# Patient Record
Sex: Female | Born: 1980 | Race: White | Hispanic: No | Marital: Single | Smoking: Former smoker
Health system: Southern US, Community
[De-identification: ages and names within clinical notes are randomized; demographics above are authoritative.]

## PROBLEM LIST (undated history)

## (undated) DIAGNOSIS — J45909 Unspecified asthma, uncomplicated: Secondary | ICD-10-CM

## (undated) DIAGNOSIS — M109 Gout, unspecified: Secondary | ICD-10-CM

## (undated) DIAGNOSIS — F1910 Other psychoactive substance abuse, uncomplicated: Secondary | ICD-10-CM

## (undated) DIAGNOSIS — F419 Anxiety disorder, unspecified: Secondary | ICD-10-CM

## (undated) DIAGNOSIS — F1990 Other psychoactive substance use, unspecified, uncomplicated: Secondary | ICD-10-CM

## (undated) DIAGNOSIS — F329 Major depressive disorder, single episode, unspecified: Secondary | ICD-10-CM

## (undated) DIAGNOSIS — F32A Depression, unspecified: Secondary | ICD-10-CM

## (undated) HISTORY — DX: Depression, unspecified: F32.A

## (undated) HISTORY — DX: Other psychoactive substance abuse, uncomplicated: F19.10

## (undated) HISTORY — DX: Major depressive disorder, single episode, unspecified: F32.9

## (undated) HISTORY — DX: Anxiety disorder, unspecified: F41.9

---

## 2015-11-30 ENCOUNTER — Emergency Department: Payer: Self-pay

## 2015-11-30 ENCOUNTER — Emergency Department
Admission: EM | Admit: 2015-11-30 | Discharge: 2015-11-30 | Disposition: A | Discharge status: Home / Self Care | Payer: Self-pay | Attending: Emergency Medicine | Admitting: Emergency Medicine

## 2015-11-30 ENCOUNTER — Encounter: Payer: Self-pay | Admitting: Emergency Medicine

## 2015-11-30 DIAGNOSIS — G8929 Other chronic pain: Secondary | ICD-10-CM | POA: Insufficient documentation

## 2015-11-30 DIAGNOSIS — F17210 Nicotine dependence, cigarettes, uncomplicated: Secondary | ICD-10-CM | POA: Insufficient documentation

## 2015-11-30 DIAGNOSIS — Y990 Civilian activity done for income or pay: Secondary | ICD-10-CM | POA: Insufficient documentation

## 2015-11-30 DIAGNOSIS — X58XXXA Exposure to other specified factors, initial encounter: Secondary | ICD-10-CM | POA: Insufficient documentation

## 2015-11-30 DIAGNOSIS — Y929 Unspecified place or not applicable: Secondary | ICD-10-CM | POA: Insufficient documentation

## 2015-11-30 DIAGNOSIS — S39012A Strain of muscle, fascia and tendon of lower back, initial encounter: Secondary | ICD-10-CM | POA: Insufficient documentation

## 2015-11-30 DIAGNOSIS — Y9389 Activity, other specified: Secondary | ICD-10-CM | POA: Insufficient documentation

## 2015-11-30 LAB — POC URINE PREG, ED: Preg Test, Ur: NEGATIVE

## 2015-11-30 MED ORDER — KETOROLAC TROMETHAMINE 60 MG/2ML IM SOLN
INTRAMUSCULAR | Status: AC
  Administered 2015-11-30: 60 mg via INTRAMUSCULAR
  Filled 2015-11-30: qty 2

## 2015-11-30 MED ORDER — KETOROLAC TROMETHAMINE 60 MG/2ML IM SOLN
60.0000 mg | Freq: Once | INTRAMUSCULAR | Status: AC
  Administered 2015-11-30: 60 mg via INTRAMUSCULAR

## 2015-11-30 MED ORDER — TRAMADOL HCL 50 MG PO TABS: 50.0000 mg | ORAL_TABLET | Freq: Four times a day (QID) | ORAL | 0 refills | Status: DC | PRN

## 2015-11-30 MED ORDER — CYCLOBENZAPRINE HCL 10 MG PO TABS
10.0000 mg | ORAL_TABLET | Freq: Three times a day (TID) | ORAL | 0 refills | Status: DC | PRN
Start: 2015-11-30 — End: 2016-06-02

## 2015-11-30 NOTE — ED Notes (Signed)
Patient transported to X-ray 

## 2015-11-30 NOTE — ED Triage Notes (Signed)
Patient reports right-sided lower back pain since yesterday. States the pain radiates down her leg to her foot. Denies injury. Denies urinary symptoms. Reports history of back problems in the past.

## 2015-11-30 NOTE — ED Provider Notes (Signed)
[facility] Emergency Department Provider Note   ____________________________________________   First MD Initiated Contact with Patient 11/30/15 1314     (approximate)  I have reviewed the triage vital signs and the nursing notes.   HISTORY  Chief Complaint Back Pain    HPI Caitlin GriffithsKelly Uliano is a 35 y.o. female patient complaining of right upper and low back pain since yesterday. Patient stated as of radicular component to her back pain to her right leg patient denies any injury. Patient state she has a history of chronic back pain which has not bothered her for the past 6 months. Patient state the past 6 months she has not been working. Patient started working 3 weeks ago. Patient stated no specific provocative incident happened at work yesterday. Patient denies any bladder or bowel dysfunction. Patient rates the pain as a 9/10. Patient described a pain as "sharp". No palliative measures for this complaint.   History reviewed. No pertinent past medical history.  There are no active problems to display for this patient.   Past Surgical History:  Procedure Laterality Date  . CESAREAN SECTION      Prior to Admission medications   Medication Sig Start Date End Date Taking? Authorizing Provider  cyclobenzaprine (FLEXERIL) 10 MG tablet Take 1 tablet (10 mg total) by mouth 3 (three) times daily as needed. 11/30/15   Joni Reiningonald K Thad Osoria, PA-C  traMADol (ULTRAM) 50 MG tablet Take 1 tablet (50 mg total) by mouth every 6 (six) hours as needed. 11/30/15 11/29/16  Joni Reiningonald K Kanija Remmel, PA-C    Allergies Patient has no known allergies.  No family history on file.  Social History Social History  Substance Use Topics  . Smoking status: Current Every Day Smoker    Packs/day: 0.50    Types: Cigarettes  . Smokeless tobacco: Never Used  . Alcohol use No    Review of Systems Constitutional: No fever/chills Eyes: No visual changes. ENT: No sore throat. Cardiovascular:  Denies chest pain. Respiratory: Denies shortness of breath. Gastrointestinal: No abdominal pain.  No nausea, no vomiting.  No diarrhea.  No constipation. Genitourinary: Negative for dysuria. Musculoskeletal: Positive for back pain  Skin: Negative for rash. Neurological: Negative for headaches, focal weakness or numbness.    ____________________________________________   PHYSICAL EXAM:  VITAL SIGNS: ED Triage Vitals  Enc Vitals Group     BP 11/30/15 1309 (!) 115/59     Pulse Rate 11/30/15 1309 90     Resp 11/30/15 1309 18     Temp 11/30/15 1309 98.4 F (36.9 C)     Temp Source 11/30/15 1309 Oral     SpO2 11/30/15 1309 99 %     Weight 11/30/15 1309 185 lb (83.9 kg)     Height 11/30/15 1309 5\' 2"  (1.575 m)     Head Circumference --      Peak Flow --      Pain Score 11/30/15 1310 9     Pain Loc --      Pain Edu? --      Excl. in GC? --     Constitutional: Alert and oriented.Moderate distress Eyes: Conjunctivae are normal. PERRL. EOMI. Head: Atraumatic. Nose: No congestion/rhinnorhea. Mouth/Throat: Mucous membranes are moist.  Oropharynx non-erythematous. Neck: No stridor.  No cervical spine tenderness to palpation. Hematological/Lymphatic/Immunilogical: No cervical lymphadenopathy. Cardiovascular: Normal rate, regular rhythm. Grossly normal heart sounds.  Good peripheral circulation. Respiratory: Normal respiratory effort.  No retractions. Lungs CTAB. Gastrointestinal: Soft and nontender. No distention. No abdominal  bruits. No CVA tenderness. Musculoskeletal: No obvious deformity to the lumbar spine. Patient moderate guarding palpation all spinal processes. Patient decreased range of motion's all fields. From a sitting position patient had a right straight leg test. Neurologic:  Normal speech and language. No gross focal neurologic deficits are appreciated. No gait instability. Skin:  Skin is warm, dry and intact. No rash noted. Psychiatric: Mood and affect are normal.  Speech and behavior are normal.  ____________________________________________   LABS (all labs ordered are listed, but only abnormal results are displayed)  Labs Reviewed  POC URINE PREG, ED   ____________________________________________  EKG   ____________________________________________  RADIOLOGY  No acute findings on x-ray of the lumbar spine. ____________________________________________   PROCEDURES  Procedure(s) performed: None  Procedures  Critical Care performed: No  ____________________________________________   INITIAL IMPRESSION / ASSESSMENT AND PLAN / ED COURSE  Pertinent labs & imaging results that were available during my care of the patient were reviewed by me and considered in my medical decision making (see chart for details). Acute low back pain. Discussed negative x-ray finding with patient. Patient given discharge care instructions. Patient given a prescription for Flexeril and tramadol. Patient given a work note. She advised [facility] condition [facility].  Clinical Course      ____________________________________________   FINAL CLINICAL IMPRESSION(S) / ED DIAGNOSES  Final diagnoses:  Strain of lumbar region, initial encounter      NEW MEDICATIONS STARTED DURING THIS VISIT:  New Prescriptions   CYCLOBENZAPRINE (FLEXERIL) 10 MG TABLET    Take 1 tablet (10 mg total) by mouth 3 (three) times daily as needed.   TRAMADOL (ULTRAM) 50 MG TABLET    Take 1 tablet (50 mg total) by mouth every 6 (six) hours as needed.     Note:  This document was prepared using Dragon voice recognition software and may include unintentional dictation errors.    Joni Reiningonald K Chalese Peach, PA-C 11/30/15 1455    Jene Everyobert Kinner, MD 11/30/15 [phone]

## 2015-12-06 ENCOUNTER — Emergency Department
Admission: EM | Admit: 2015-12-06 | Discharge: 2015-12-06 | Disposition: A | Discharge status: Home / Self Care | Payer: Self-pay | Attending: Emergency Medicine | Admitting: Emergency Medicine

## 2015-12-06 DIAGNOSIS — F17210 Nicotine dependence, cigarettes, uncomplicated: Secondary | ICD-10-CM | POA: Insufficient documentation

## 2015-12-06 DIAGNOSIS — M5441 Lumbago with sciatica, right side: Secondary | ICD-10-CM | POA: Insufficient documentation

## 2015-12-06 DIAGNOSIS — Z79899 Other long term (current) drug therapy: Secondary | ICD-10-CM | POA: Insufficient documentation

## 2015-12-06 MED ORDER — PREDNISONE 10 MG PO TABS: ORAL_TABLET | ORAL | 0 refills | Status: DC

## 2015-12-06 NOTE — ED Provider Notes (Signed)
[facility] Emergency Department Provider Note   ____________________________________________   First MD Initiated Contact with Patient 12/06/15 1427     (approximate)  I have reviewed the triage vital signs and the nursing notes.   HISTORY  Chief Complaint Back Pain    HPI Caitlin GriffithsKelly Hamler is a 35 y.o. female is here complaining of low back pain. Patient was seen last week for the same in which urinalysis was negative and lumbar spine x-ray did not show any abnormalities. Patient continues to deny any urinary symptoms. Patient states that her pain radiates now into her right leg. She denies any incontinence of bowel or bladder. Patient continues to ambulate without assistance. [facility]. Patient was prescribed tramadol and Flexeril when she was seen in the emergency room on 11/30/15. Patient states her pain today is an 8 out of 10.   No past medical history on file.  There are no active problems to display for this patient.   Past Surgical History:  Procedure Laterality Date  . CESAREAN SECTION      Prior to Admission medications   Medication Sig Start Date End Date Taking? Authorizing Provider  cyclobenzaprine (FLEXERIL) 10 MG tablet Take 1 tablet (10 mg total) by mouth 3 (three) times daily as needed. 11/30/15   Joni Reiningonald K Smith, PA-C  predniSONE (DELTASONE) 10 MG tablet Take 6 tablets  today, on day 2 take 5 tablets, day 3 take 4 tablets, day 4 take 3 tablets, day 5 take  2 tablets and 1 tablet the last day 12/06/15   Tommi Rumpshonda L Ashyra Cantin, PA-C  traMADol (ULTRAM) 50 MG tablet Take 1 tablet (50 mg total) by mouth every 6 (six) hours as needed. 11/30/15 11/29/16  Joni Reiningonald K Smith, PA-C    Allergies Patient has no known allergies.  No family history on file.  Social History Social History  Substance Use Topics  . Smoking status: Current Every Day Smoker    Packs/day: 0.50    Types: Cigarettes  . Smokeless tobacco:  Never Used  . Alcohol use No    Review of Systems Constitutional: No fever/chills Cardiovascular: Denies chest pain. Respiratory: Denies shortness of breath. Gastrointestinal: No abdominal pain.  No nausea, no vomiting.  Genitourinary: Negative for dysuria. Musculoskeletal: Positive for back pain with radiculopathy right leg. Skin: Negative for rash. Neurological: Negative for headaches, focal weakness or numbness.  10-point ROS otherwise negative.  ____________________________________________   PHYSICAL EXAM:  VITAL SIGNS: ED Triage Vitals  Enc Vitals Group     BP 12/06/15 1314 114/69     Pulse Rate 12/06/15 1314 97     Resp 12/06/15 1314 18     Temp 12/06/15 1314 98.5 F (36.9 C)     Temp Source 12/06/15 1314 Oral     SpO2 12/06/15 1314 99 %     Weight 12/06/15 1316 190 lb (86.2 kg)     Height 12/06/15 1316 5\' 2"  (1.575 m)     Head Circumference --      Peak Flow --      Pain Score 12/06/15 1317 8     Pain Loc --      Pain Edu? --      Excl. in GC? --     Constitutional: Alert and oriented. Well appearing and in no acute distress. Eyes: Conjunctivae are normal. PERRL. EOMI. Head: Atraumatic. Nose: No congestion/rhinnorhea. Neck: No stridor.   Cardiovascular: Normal rate, regular rhythm. Grossly normal heart  sounds.  Good peripheral circulation. Respiratory: Normal respiratory effort.  No retractions. Lungs CTAB. Gastrointestinal: Soft and nontender. No distention.  Musculoskeletal: On examination of the back there is no gross deformity noted and no active muscle spasm seen with range of motion. Initially when going in the room patient was bent over the arm of the exam chair asleep. There is diffuse tenderness on palpation but no point tenderness noted. Straight leg raises were 90 and negative in the left. Slightly positive on the right. Good muscle strength bilaterally. Normal gait was noted without assistance. Neurologic:  Normal speech and language. No gross  focal neurologic deficits are appreciated. No gait instability. Reflexes were 2+ bilaterally. Skin:  Skin is warm, dry and intact. No rash noted. Psychiatric: Mood and affect are normal. Speech and behavior are normal.  ____________________________________________   LABS (all labs ordered are listed, but only abnormal results are displayed)  Labs Reviewed - No data to display  RADIOLOGY  Reviewed from previous ER visit. ____________________________________________   PROCEDURES  Procedure(s) performed: None  Procedures  Critical Care performed: No  ____________________________________________   INITIAL IMPRESSION / ASSESSMENT AND PLAN / ED COURSE  Pertinent labs & imaging results that were available during my care of the patient were reviewed by me and considered in my medical decision making (see chart for details).    Clinical Course    Patient was started on prednisone 60 mg 6 day taper. Patient is encouraged to follow-up with Dr. Rosita KeaMenz if any continued problems with her back. Patient was given a note to take to work saying that she was in the emergency room today.  ____________________________________________   FINAL CLINICAL IMPRESSION(S) / ED DIAGNOSES  Final diagnoses:  Acute bilateral low back pain with right-sided sciatica      NEW MEDICATIONS STARTED DURING THIS VISIT:  New Prescriptions   PREDNISONE (DELTASONE) 10 MG TABLET    Take 6 tablets  today, on day 2 take 5 tablets, day 3 take 4 tablets, day 4 take 3 tablets, day 5 take  2 tablets and 1 tablet the last day     Note:  This document was prepared using Dragon voice recognition software and may include unintentional dictation errors.    Tommi RumpsRhonda L Alexismarie Flaim, PA-C 12/06/15 1450    Sharman CheekPhillip Stafford, MD 12/07/15 2216

## 2015-12-06 NOTE — Discharge Instructions (Signed)
Begin taking prednisone 60 mg today and tapered down as treated. Ice or heat to your back as needed for comfort. Follow-up with Dr. Rosita KeaMenz who is in the orthopedic department at [facility].

## 2015-12-06 NOTE — ED Triage Notes (Signed)
Patient report lumbar pain that radiates down right leg. Patient states she was seen here last week. Patient states lumbar pain is still present. Patient denies dysuria.

## 2015-12-06 NOTE — ED Notes (Signed)
See triage note  conts to have pain to lower back and into right leg ambulates slowly d/t pain

## 2016-04-03 ENCOUNTER — Encounter: Payer: Self-pay | Admitting: Emergency Medicine

## 2016-04-03 ENCOUNTER — Emergency Department
Admission: EM | Admit: 2016-04-03 | Discharge: 2016-04-03 | Disposition: A | Discharge status: Home / Self Care | Payer: Self-pay | Attending: Emergency Medicine | Admitting: Emergency Medicine

## 2016-04-03 DIAGNOSIS — N941 Unspecified dyspareunia: Secondary | ICD-10-CM | POA: Insufficient documentation

## 2016-04-03 DIAGNOSIS — A599 Trichomoniasis, unspecified: Secondary | ICD-10-CM | POA: Insufficient documentation

## 2016-04-03 DIAGNOSIS — F17210 Nicotine dependence, cigarettes, uncomplicated: Secondary | ICD-10-CM | POA: Insufficient documentation

## 2016-04-03 LAB — CHLAMYDIA/NGC RT PCR (ARMC ONLY)
CHLAMYDIA TR: NOT DETECTED
N GONORRHOEAE: NOT DETECTED

## 2016-04-03 LAB — URINALYSIS, ROUTINE W REFLEX MICROSCOPIC
Bacteria, UA: NONE SEEN
Bilirubin Urine: NEGATIVE
GLUCOSE, UA: NEGATIVE mg/dL
Ketones, ur: NEGATIVE mg/dL
NITRITE: NEGATIVE
PH: 6 (ref 5.0–8.0)
PROTEIN: NEGATIVE mg/dL
Specific Gravity, Urine: 1.02 (ref 1.005–1.030)

## 2016-04-03 LAB — WET PREP, GENITAL
CLUE CELLS WET PREP: NONE SEEN
Yeast Wet Prep HPF POC: NONE SEEN

## 2016-04-03 LAB — POCT PREGNANCY, URINE: Preg Test, Ur: NEGATIVE

## 2016-04-03 LAB — HCG, QUANTITATIVE, PREGNANCY

## 2016-04-03 MED ORDER — METRONIDAZOLE 500 MG PO TABS: 500.0000 mg | ORAL_TABLET | Freq: Two times a day (BID) | ORAL | 0 refills | Status: AC

## 2016-04-03 NOTE — Discharge Instructions (Addendum)
You have been found to have a common sexually transmitted infection. You must take the prescription med as directed, until all pills are gone. Your sexual partner(s) should also be treated. You should avoid sexual contact until all pills are gone and symptoms have resolved. [facility] for further testing and treatment.

## 2016-04-03 NOTE — ED Provider Notes (Signed)
[facility] Emergency Department Provider Note ____________________________________________  Time seen: 1649  I have reviewed the triage vital signs and the nursing notes.  HISTORY  Chief Complaint  Pelvic Pain  HPI Caitlin Schneider is a 36 y.o. female presents to the ED with her admitted female sexual partner, for evaluation of sudden onset of dyspareunia this morning and 2 days of pinkish discharge with wiping. She denies fevers, chills, sweats, or nausea. She denies dysuria or gross hematuria. She reports her LMP was 2 weeks ago, but admits it only lasted 1 day, which is unusual.   History reviewed. No pertinent past medical history.  There are no active problems to display for this patient.  Past Surgical History:  Procedure Laterality Date  . CESAREAN SECTION      Prior to Admission medications   Medication Sig Start Date End Date Taking? Authorizing Provider  cyclobenzaprine (FLEXERIL) 10 MG tablet Take 1 tablet (10 mg total) by mouth 3 (three) times daily as needed. 11/30/15   Joni Reining, PA-C  metroNIDAZOLE (FLAGYL) 500 MG tablet Take 1 tablet (500 mg total) by mouth 2 (two) times daily. 04/03/16 04/10/16  Donja Tipping V Bacon Makyia Erxleben, PA-C  predniSONE (DELTASONE) 10 MG tablet Take 6 tablets  today, on day 2 take 5 tablets, day 3 take 4 tablets, day 4 take 3 tablets, day 5 take  2 tablets and 1 tablet the last day 12/06/15   Tommi Rumps, PA-C  traMADol (ULTRAM) 50 MG tablet Take 1 tablet (50 mg total) by mouth every 6 (six) hours as needed. 11/30/15 11/29/16  Joni Reining, PA-C   Allergies Patient has no known allergies.  No family history on file.  Social History Social History  Substance Use Topics  . Smoking status: Current Every Day Smoker    Packs/day: 0.50    Types: Cigarettes  . Smokeless tobacco: Never Used  . Alcohol use No    Review of Systems  Constitutional: Negative for fever. Cardiovascular: Negative for chest pain. Respiratory:  Negative for shortness of breath. Gastrointestinal: Negative for abdominal pain, vomiting and diarrhea. Genitourinary: Negative for dysuria. Pelvic pain with intercourse & pink discharge on toilet tissue.  Musculoskeletal: Negative for back pain. Skin: Negative for rash. Neurological: Negative for headaches, focal weakness or numbness. ____________________________________________  PHYSICAL EXAM:  VITAL SIGNS: ED Triage Vitals  Enc Vitals Group     BP 04/03/16 1616 119/75     Pulse Rate 04/03/16 1616 87     Resp 04/03/16 1616 16     Temp 04/03/16 1616 98.6 F (37 C)     Temp Source 04/03/16 1616 Oral     SpO2 04/03/16 1616 98 %     Weight 04/03/16 1617 150 lb (68 kg)     Height 04/03/16 1617 5\' 2"  (1.575 m)     Head Circumference --      Peak Flow --      Pain Score 04/03/16 1617 7     Pain Loc --      Pain Edu? --      Excl. in GC? --     Constitutional: Alert and oriented. Well appearing and in no distress. Head: Normocephalic and atraumatic. Cardiovascular: Normal rate, regular rhythm. Normal distal pulses. Respiratory: Normal respiratory effort. No wheezes/rales/rhonchi. Gastrointestinal: Soft and nontender. No distention. GU: Normal external genitalia. Scant milky vaginal discharge. Cervix with mucous and scant blood from the os. No adnexal masses or CMT.  Musculoskeletal: Nontender with normal range of motion  in all extremities.  Neurologic:  Normal gait without ataxia. Normal speech and language. No gross focal neurologic deficits are appreciated. ____________________________________________   LABS (pertinent positives/negatives) Labs Reviewed  WET PREP, GENITAL - Abnormal; Notable for the following:       Result Value   Trich, Wet Prep PRESENT (*)    WBC, Wet Prep HPF POC RARE (*)    All other components within normal limits  URINALYSIS, ROUTINE W REFLEX MICROSCOPIC - Abnormal; Notable for the following:    Color, Urine YELLOW (*)    APPearance CLEAR (*)     Hgb urine dipstick SMALL (*)    Leukocytes, UA TRACE (*)    Squamous Epithelial / LPF 0-5 (*)    All other components within normal limits  CHLAMYDIA/NGC RT PCR ([facility] ONLY)  HCG, QUANTITATIVE, PREGNANCY  POC URINE PREG, ED  POCT PREGNANCY, URINE  ____________________________________________  INITIAL IMPRESSION / ASSESSMENT AND PLAN / ED COURSE  Patient with dyspareunia due to trichomoniasis infection. She is notified of the lab confirmation and provided with a prescription for metronidazole. She (and her partner) are advised of the need for treatment and follow-up. She is further referred to the [facility] for further testing and treatment.  ____________________________________________  FINAL CLINICAL IMPRESSION(S) / ED DIAGNOSES  Final diagnoses:  Trichimoniasis  Dyspareunia in female     Lissa HoardJenise V Bacon Bhavesh Vazquez, PA-C 04/03/16 2142    Arnaldo NatalPaul F Malinda, MD 04/03/16 2326

## 2016-04-03 NOTE — ED Triage Notes (Signed)
Pt to ED via POV, pt c/o pelvic pain since this am during intercourse , states spotting with urination describes blood as "light pink." Pt ambulatory, A&Ox4, VS stable.

## 2016-05-30 ENCOUNTER — Inpatient Hospital Stay
Admission: EM | Admit: 2016-05-30 | Discharge: 2016-06-02 | DRG: 872 | Disposition: A | Discharge status: Home / Self Care | Payer: Self-pay | Attending: Specialist | Admitting: Specialist

## 2016-05-30 ENCOUNTER — Encounter: Payer: Self-pay | Admitting: Emergency Medicine

## 2016-05-30 ENCOUNTER — Emergency Department: Payer: Self-pay

## 2016-05-30 DIAGNOSIS — L02414 Cutaneous abscess of left upper limb: Secondary | ICD-10-CM | POA: Diagnosis present

## 2016-05-30 DIAGNOSIS — L03114 Cellulitis of left upper limb: Secondary | ICD-10-CM

## 2016-05-30 DIAGNOSIS — K219 Gastro-esophageal reflux disease without esophagitis: Secondary | ICD-10-CM | POA: Diagnosis present

## 2016-05-30 DIAGNOSIS — Z825 Family history of asthma and other chronic lower respiratory diseases: Secondary | ICD-10-CM

## 2016-05-30 DIAGNOSIS — J45909 Unspecified asthma, uncomplicated: Secondary | ICD-10-CM | POA: Diagnosis present

## 2016-05-30 DIAGNOSIS — F1910 Other psychoactive substance abuse, uncomplicated: Secondary | ICD-10-CM | POA: Diagnosis present

## 2016-05-30 DIAGNOSIS — F1990 Other psychoactive substance use, unspecified, uncomplicated: Secondary | ICD-10-CM | POA: Diagnosis present

## 2016-05-30 DIAGNOSIS — A419 Sepsis, unspecified organism: Principal | ICD-10-CM | POA: Diagnosis present

## 2016-05-30 DIAGNOSIS — F17210 Nicotine dependence, cigarettes, uncomplicated: Secondary | ICD-10-CM | POA: Diagnosis present

## 2016-05-30 DIAGNOSIS — E876 Hypokalemia: Secondary | ICD-10-CM | POA: Diagnosis present

## 2016-05-30 DIAGNOSIS — L0291 Cutaneous abscess, unspecified: Secondary | ICD-10-CM

## 2016-05-30 DIAGNOSIS — L0390 Cellulitis, unspecified: Secondary | ICD-10-CM | POA: Diagnosis present

## 2016-05-30 HISTORY — DX: Other psychoactive substance use, unspecified, uncomplicated: F19.90

## 2016-05-30 HISTORY — DX: Unspecified asthma, uncomplicated: J45.909

## 2016-05-30 LAB — COMPREHENSIVE METABOLIC PANEL
ALBUMIN: 4 g/dL (ref 3.5–5.0)
ALT: 32 U/L (ref 14–54)
AST: 27 U/L (ref 15–41)
Alkaline Phosphatase: 85 U/L (ref 38–126)
Anion gap: 15 (ref 5–15)
BUN: 11 mg/dL (ref 6–20)
CHLORIDE: 96 mmol/L — AB (ref 101–111)
CO2: 19 mmol/L — AB (ref 22–32)
CREATININE: 1.09 mg/dL — AB (ref 0.44–1.00)
Calcium: 8.9 mg/dL (ref 8.9–10.3)
GFR calc Af Amer: >60 mL/min (ref >60)
GLUCOSE: 101 mg/dL — AB (ref 65–99)
POTASSIUM: 3.6 mmol/L (ref 3.5–5.1)
Sodium: 130 mmol/L — ABNORMAL LOW (ref 135–145)
Total Bilirubin: 2.6 mg/dL — ABNORMAL HIGH (ref 0.3–1.2)
Total Protein: 7.9 g/dL (ref 6.5–8.1)

## 2016-05-30 LAB — LACTIC ACID, PLASMA: Lactic Acid, Venous: 1 mmol/L (ref 0.5–1.9)

## 2016-05-30 LAB — CBC WITH DIFFERENTIAL/PLATELET
BASOS ABS: 0.1 10*3/uL (ref 0–0.1)
Basophils Relative: 0 %
EOS PCT: 0 %
Eosinophils Absolute: 0 10*3/uL (ref 0–0.7)
HEMATOCRIT: 47.3 % — AB (ref 35.0–47.0)
Hemoglobin: 16.8 g/dL — ABNORMAL HIGH (ref 12.0–16.0)
LYMPHS ABS: 2 10*3/uL (ref 1.0–3.6)
LYMPHS PCT: 11 %
MCH: 30.3 pg (ref 26.0–34.0)
MCHC: 35.4 g/dL (ref 32.0–36.0)
MCV: 85.4 fL (ref 80.0–100.0)
MONO ABS: 1.3 10*3/uL — AB (ref 0.2–0.9)
MONOS PCT: 7 %
NEUTROS ABS: 14.8 10*3/uL — AB (ref 1.4–6.5)
Neutrophils Relative %: 82 %
PLATELETS: 261 10*3/uL (ref 150–440)
RBC: 5.54 MIL/uL — ABNORMAL HIGH (ref 3.80–5.20)
RDW: 14.2 % (ref 11.5–14.5)
WBC: 18.2 10*3/uL — ABNORMAL HIGH (ref 3.6–11.0)

## 2016-05-30 MED ORDER — SODIUM CHLORIDE 0.9 % IV BOLUS (SEPSIS)
1000.0000 mL | Freq: Once | INTRAVENOUS | Status: AC
  Administered 2016-05-30: 1000 mL via INTRAVENOUS

## 2016-05-30 MED ORDER — HYDROMORPHONE HCL 1 MG/ML IJ SOLN
1.0000 mg | Freq: Once | INTRAMUSCULAR | Status: AC
  Administered 2016-05-30: 1 mg via INTRAVENOUS
  Filled 2016-05-30: qty 1

## 2016-05-30 MED ORDER — VANCOMYCIN HCL IN DEXTROSE 1-5 GM/200ML-% IV SOLN
1000.0000 mg | Freq: Once | INTRAVENOUS | Status: AC
  Administered 2016-05-30: 1000 mg via INTRAVENOUS
  Filled 2016-05-30: qty 200

## 2016-05-30 MED ORDER — PIPERACILLIN-TAZOBACTAM 3.375 G IVPB 30 MIN
3.3750 g | Freq: Once | INTRAVENOUS | Status: AC
  Administered 2016-05-30: 3.375 g via INTRAVENOUS
  Filled 2016-05-30: qty 50

## 2016-05-30 NOTE — ED Triage Notes (Signed)
Patient ambulatory to triage with steady gait, without difficulty or distress noted; pt reports having swelling to left arm last couple days; IV cocaine use; large amount swelling/redness noted

## 2016-05-30 NOTE — ED Provider Notes (Signed)
[facility] Emergency Department Provider Note    First MD Initiated Contact with Patient 05/30/16 2259     (approximate)  I have reviewed the triage vital signs and the nursing notes.   HISTORY  Chief Complaint Cellulitis    HPI Caitlin Schneider is a 36 y.o. female with history of Asthma and IVDA (IV drug abuse) presents with left arm swelling redness and pain with current pain score 10 out of 10 that is described as sharp and throbbing. Patient states that the last time she had IV drug use in that arm is a week ago. Patient does however admit to IV drug use today in her right hand. Patient admits to fever and chills over the course of today.   Past Medical History:  Diagnosis Date  . Asthma     There are no active problems to display for this patient.   Past Surgical History:  Procedure Laterality Date  . CESAREAN SECTION      Prior to Admission medications   Medication Sig Start Date End Date Taking? Authorizing Provider  cyclobenzaprine (FLEXERIL) 10 MG tablet Take 1 tablet (10 mg total) by mouth 3 (three) times daily as needed. Patient not taking: Reported on 05/30/2016 11/30/15   Joni Reining, PA-C  predniSONE (DELTASONE) 10 MG tablet Take 6 tablets  today, on day 2 take 5 tablets, day 3 take 4 tablets, day 4 take 3 tablets, day 5 take  2 tablets and 1 tablet the last day Patient not taking: Reported on 05/30/2016 12/06/15   Tommi Rumps, PA-C  traMADol (ULTRAM) 50 MG tablet Take 1 tablet (50 mg total) by mouth every 6 (six) hours as needed. Patient not taking: Reported on 05/30/2016 11/30/15 11/29/16  Joni Reining, PA-C    Allergies Patient has no known allergies.  No family history on file.  Social History Social History  Substance Use Topics  . Smoking status: Current Every Day Smoker    Packs/day: 0.50    Types: Cigarettes  . Smokeless tobacco: Never Used  . Alcohol use No    Review of Systems Constitutional: No  fever/chills Eyes: No visual changes. ENT: No sore throat. Cardiovascular: Denies chest pain. Respiratory: Denies shortness of breath. Gastrointestinal: No abdominal pain.  No nausea, no vomiting.  No diarrhea.  No constipation. Genitourinary: Negative for dysuria. Musculoskeletal: Negative for back pain. Integumentary: Negative for rash. Neurological: Negative for headaches, focal weakness or numbness.   ____________________________________________   PHYSICAL EXAM:  VITAL SIGNS: ED Triage Vitals [05/30/16 2249]  Enc Vitals Group     BP (!) 134/95     Pulse Rate (!) 127     Resp (!) 22     Temp 99.9 F (37.7 C)     Temp Source Oral     SpO2 98 %     Weight 180 lb (81.6 kg)     Height 5\' 2"  (1.575 m)     Head Circumference      Peak Flow      Pain Score 10     Pain Loc      Pain Edu?      Excl. in GC?     Constitutional: Alert and oriented. Well appearing and in no acute distress. Eyes: Conjunctivae are normal. PERRL. EOMI. Head: Atraumatic. Mouth/Throat: Mucous membranes are moist.  Oropharynx non-erythematous. Neck: No stridor.   Cardiovascular: Normal rate, regular rhythm. Good peripheral circulation. Grossly normal heart sounds. Respiratory: Normal respiratory effort.  No retractions.  Lungs CTAB. Gastrointestinal: Soft and nontender. No distention.  Musculoskeletal: Left arm swelling extending from axilla to hand with overlying erythema distinct pustules noted in the left antecubital fossa. Neurologic:  Normal speech and language. No gross focal neurologic deficits are appreciated.  Skin:  Skin is warm, dry and intact. No rash noted. Psychiatric: Mood and affect are normal. Speech and behavior are normal.  ____________________________________________   LABS (all labs ordered are listed, but only abnormal results are displayed)  Labs Reviewed  CULTURE, BLOOD (ROUTINE X 2)  CULTURE, BLOOD (ROUTINE X 2)  COMPREHENSIVE METABOLIC PANEL  CBC WITH  DIFFERENTIAL/PLATELET  LACTIC ACID, PLASMA  LACTIC ACID, PLASMA  URINALYSIS, COMPLETE (UACMP) WITH MICROSCOPIC   ____________________________________________  EKG  ED ECG REPORT I, [place] N BROWN, the attending physician, personally viewed and interpreted this ECG.   Date: 05/31/2016  EKG Time: 11:27 PM  Rate: 104  Rhythm: Sinus tachycardia  Axis: Normal   Intervals: Normal  ST&T Change: None _______________________  RADIOLOGY I, [place] N BROWN, personally viewed and evaluated these images (plain radiographs) as part of my medical decision making, as well as reviewing the written report by the radiologist.  No results found.  ____________________________________________   PROCEDURES  Critical Care performed: CRITICAL CARE Performed by: Darci CurrentANDOLPH N BROWN   Total critical care time: 45 minutes  Critical care time was exclusive of separately billable procedures and treating other patients.  Critical care was necessary to treat or prevent imminent or life-threatening deterioration.  Critical care was time spent personally by me on the following activities: development of treatment plan with patient and/or surrogate as well as nursing, discussions with consultants, evaluation of patient's response to treatment, examination of patient, obtaining history from patient or surrogate, ordering and performing treatments and interventions, ordering and review of laboratory studies, ordering and review of radiographic studies, pulse oximetry and re-evaluation of patient's condition.       Procedures   ____________________________________________   INITIAL IMPRESSION / ASSESSMENT AND PLAN / ED COURSE  Pertinent labs & imaging results that were available during my care of the patient were reviewed by me and considered in my medical decision making (see chart for details).  36 year old female IV drug user presented to the emergency department with left arm/forearm  cellulitis. Patient meets criteria for sepsis tachycardic, tachypnea Febrile. As such sepsis protocol initiated. Patient will receive IV vancomycin and Zosyn.Patient discussed with Dr. Anne HahnWillis for hospital admission further evaluation and management.      ____________________________________________  FINAL CLINICAL IMPRESSION(S) / ED DIAGNOSES  Final diagnoses:  Sepsis, due to unspecified organism (HCC)  Abscess  Left arm cellulitis     MEDICATIONS GIVEN DURING THIS VISIT:  Medications  piperacillin-tazobactam (ZOSYN) IVPB 3.375 g (not administered)  vancomycin (VANCOCIN) IVPB 1000 mg/200 mL premix (not administered)     NEW OUTPATIENT MEDICATIONS STARTED DURING THIS VISIT:  New Prescriptions   No medications on file    Modified Medications   No medications on file    Discontinued Medications   No medications on file     Note:  This document was prepared using Dragon voice recognition software and may include unintentional dictation errors.    Darci CurrentBrown, [place] N, MD 05/31/16 [phone]

## 2016-05-31 ENCOUNTER — Inpatient Hospital Stay: Payer: Self-pay | Admitting: Registered Nurse

## 2016-05-31 ENCOUNTER — Encounter
Admission: EM | Disposition: A | Discharge status: Home / Self Care | Payer: Self-pay | Source: Home / Self Care | Attending: Specialist

## 2016-05-31 ENCOUNTER — Encounter: Payer: Self-pay | Admitting: Anesthesiology

## 2016-05-31 DIAGNOSIS — L0291 Cutaneous abscess, unspecified: Secondary | ICD-10-CM

## 2016-05-31 DIAGNOSIS — L03114 Cellulitis of left upper limb: Secondary | ICD-10-CM

## 2016-05-31 HISTORY — PX: INCISION AND DRAINAGE ABSCESS: SHX5864

## 2016-05-31 LAB — URINE DRUG SCREEN, QUALITATIVE (ARMC ONLY)
Amphetamines, Ur Screen: POSITIVE — AB
BARBITURATES, UR SCREEN: NOT DETECTED
Benzodiazepine, Ur Scrn: NOT DETECTED
COCAINE METABOLITE, UR ~~LOC~~: NOT DETECTED
Cannabinoid 50 Ng, Ur ~~LOC~~: POSITIVE — AB
MDMA (Ecstasy)Ur Screen: NOT DETECTED
METHADONE SCREEN, URINE: NOT DETECTED
Opiate, Ur Screen: POSITIVE — AB
Phencyclidine (PCP) Ur S: NOT DETECTED
TRICYCLIC, UR SCREEN: NOT DETECTED

## 2016-05-31 LAB — URINALYSIS, COMPLETE (UACMP) WITH MICROSCOPIC
BACTERIA UA: NONE SEEN
BILIRUBIN URINE: NEGATIVE
Glucose, UA: NEGATIVE mg/dL
Hgb urine dipstick: NEGATIVE
Ketones, ur: 5 mg/dL — AB
LEUKOCYTES UA: NEGATIVE
NITRITE: NEGATIVE
PROTEIN: NEGATIVE mg/dL
RBC / HPF: NONE SEEN RBC/hpf (ref 0–5)
SPECIFIC GRAVITY, URINE: 1.017 (ref 1.005–1.030)
pH: 5 (ref 5.0–8.0)

## 2016-05-31 LAB — BASIC METABOLIC PANEL
ANION GAP: 10 (ref 5–15)
BUN: 9 mg/dL (ref 6–20)
CALCIUM: 8.2 mg/dL — AB (ref 8.9–10.3)
CHLORIDE: 102 mmol/L (ref 101–111)
CO2: 20 mmol/L — AB (ref 22–32)
Creatinine, Ser: 0.67 mg/dL (ref 0.44–1.00)
GFR calc non Af Amer: >60 mL/min (ref >60)
Glucose, Bld: 98 mg/dL (ref 65–99)
POTASSIUM: 3.3 mmol/L — AB (ref 3.5–5.1)
Sodium: 132 mmol/L — ABNORMAL LOW (ref 135–145)

## 2016-05-31 LAB — PREGNANCY, URINE: PREG TEST UR: NEGATIVE

## 2016-05-31 LAB — CBC
HEMATOCRIT: 44.1 % (ref 35.0–47.0)
HEMOGLOBIN: 15.3 g/dL (ref 12.0–16.0)
MCH: 29.7 pg (ref 26.0–34.0)
MCHC: 34.6 g/dL (ref 32.0–36.0)
MCV: 85.8 fL (ref 80.0–100.0)
Platelets: 225 10*3/uL (ref 150–440)
RBC: 5.14 MIL/uL (ref 3.80–5.20)
RDW: 14.3 % (ref 11.5–14.5)
WBC: 15.4 10*3/uL — ABNORMAL HIGH (ref 3.6–11.0)

## 2016-05-31 LAB — LACTIC ACID, PLASMA: LACTIC ACID, VENOUS: 0.8 mmol/L (ref 0.5–1.9)

## 2016-05-31 LAB — MAGNESIUM: MAGNESIUM: 1.6 mg/dL — AB (ref 1.7–2.4)

## 2016-05-31 SURGERY — INCISION AND DRAINAGE, ABSCESS
Anesthesia: General | Site: Arm Upper | Laterality: Left | Wound class: Dirty or Infected

## 2016-05-31 MED ORDER — FENTANYL CITRATE (PF) 100 MCG/2ML IJ SOLN
INTRAMUSCULAR | Status: DC | PRN
  Administered 2016-05-31: 100 ug via INTRAVENOUS

## 2016-05-31 MED ORDER — ACETAMINOPHEN 650 MG RE SUPP
650.0000 mg | Freq: Four times a day (QID) | RECTAL | Status: DC | PRN
Start: 2016-05-31 — End: 2016-06-02

## 2016-05-31 MED ORDER — LIDOCAINE HCL (PF) 2 % IJ SOLN
INTRAMUSCULAR | Status: AC
  Filled 2016-05-31: qty 2

## 2016-05-31 MED ORDER — ONDANSETRON HCL 4 MG/2ML IJ SOLN
INTRAMUSCULAR | Status: AC
  Filled 2016-05-31: qty 2

## 2016-05-31 MED ORDER — GLYCOPYRROLATE 0.2 MG/ML IJ SOLN
INTRAMUSCULAR | Status: DC | PRN
  Administered 2016-05-31: 0.2 mg via INTRAVENOUS

## 2016-05-31 MED ORDER — MIDAZOLAM HCL 2 MG/2ML IJ SOLN
INTRAMUSCULAR | Status: DC | PRN
  Administered 2016-05-31: 2 mg via INTRAVENOUS

## 2016-05-31 MED ORDER — POTASSIUM CHLORIDE CRYS ER 20 MEQ PO TBCR
20.0000 meq | EXTENDED_RELEASE_TABLET | Freq: Two times a day (BID) | ORAL | Status: AC
  Administered 2016-05-31 – 2016-06-01 (×4): 20 meq via ORAL
  Filled 2016-05-31 (×4): qty 1

## 2016-05-31 MED ORDER — ONDANSETRON HCL 4 MG/2ML IJ SOLN: 4.0000 mg | Freq: Four times a day (QID) | INTRAMUSCULAR | Status: DC | PRN

## 2016-05-31 MED ORDER — PIPERACILLIN-TAZOBACTAM 3.375 G IVPB 30 MIN: 3.3750 g | Freq: Once | INTRAVENOUS | Status: DC

## 2016-05-31 MED ORDER — MIDAZOLAM HCL 2 MG/2ML IJ SOLN
INTRAMUSCULAR | Status: AC
  Filled 2016-05-31: qty 2

## 2016-05-31 MED ORDER — ACETAMINOPHEN 325 MG PO TABS: 650.0000 mg | ORAL_TABLET | Freq: Four times a day (QID) | ORAL | Status: DC | PRN

## 2016-05-31 MED ORDER — SUCCINYLCHOLINE CHLORIDE 20 MG/ML IJ SOLN
INTRAMUSCULAR | Status: DC | PRN
  Administered 2016-05-31: 100 mg via INTRAVENOUS

## 2016-05-31 MED ORDER — DEXAMETHASONE SODIUM PHOSPHATE 10 MG/ML IJ SOLN
INTRAMUSCULAR | Status: DC | PRN
  Administered 2016-05-31: 10 mg via INTRAVENOUS

## 2016-05-31 MED ORDER — FENTANYL CITRATE (PF) 100 MCG/2ML IJ SOLN
INTRAMUSCULAR | Status: AC
  Administered 2016-05-31: 50 ug via INTRAVENOUS
  Filled 2016-05-31: qty 2

## 2016-05-31 MED ORDER — FENTANYL CITRATE (PF) 100 MCG/2ML IJ SOLN
25.0000 ug | INTRAMUSCULAR | Status: DC | PRN
  Administered 2016-05-31 (×2): 50 ug via INTRAVENOUS

## 2016-05-31 MED ORDER — GLYCOPYRROLATE 0.2 MG/ML IJ SOLN
INTRAMUSCULAR | Status: AC
  Filled 2016-05-31: qty 1

## 2016-05-31 MED ORDER — FENTANYL CITRATE (PF) 100 MCG/2ML IJ SOLN
INTRAMUSCULAR | Status: AC
  Filled 2016-05-31: qty 2

## 2016-05-31 MED ORDER — SUCCINYLCHOLINE CHLORIDE 20 MG/ML IJ SOLN
INTRAMUSCULAR | Status: AC
  Filled 2016-05-31: qty 1

## 2016-05-31 MED ORDER — OXYCODONE-ACETAMINOPHEN 5-325 MG PO TABS
1.0000 | ORAL_TABLET | ORAL | Status: DC | PRN
Start: 2016-05-31 — End: 2016-06-02
  Administered 2016-05-31 (×2): 2 via ORAL
  Administered 2016-05-31: 20:00:00 1 via ORAL
  Administered 2016-06-01 – 2016-06-02 (×7): 2 via ORAL
  Filled 2016-05-31 (×7): qty 2
  Filled 2016-05-31: qty 1
  Filled 2016-05-31 (×2): qty 2

## 2016-05-31 MED ORDER — SODIUM CHLORIDE 0.9 % IV SOLN
INTRAVENOUS | Status: AC
  Administered 2016-05-31: 02:00:00 via INTRAVENOUS

## 2016-05-31 MED ORDER — PIPERACILLIN-TAZOBACTAM 3.375 G IVPB
3.3750 g | Freq: Three times a day (TID) | INTRAVENOUS | Status: DC
  Administered 2016-05-31 – 2016-06-02 (×7): 3.375 g via INTRAVENOUS
  Filled 2016-05-31 (×10): qty 50

## 2016-05-31 MED ORDER — PNEUMOCOCCAL VAC POLYVALENT 25 MCG/0.5ML IJ INJ
0.5000 mL | INJECTION | INTRAMUSCULAR | Status: AC
  Administered 2016-06-01: 09:00:00 0.5 mL via INTRAMUSCULAR
  Filled 2016-05-31: qty 0.5

## 2016-05-31 MED ORDER — LACTATED RINGERS IV SOLN
INTRAVENOUS | Status: DC | PRN
  Administered 2016-05-31: 15:00:00 via INTRAVENOUS

## 2016-05-31 MED ORDER — DEXAMETHASONE SODIUM PHOSPHATE 10 MG/ML IJ SOLN
INTRAMUSCULAR | Status: AC
  Filled 2016-05-31: qty 1

## 2016-05-31 MED ORDER — ONDANSETRON HCL 4 MG/2ML IJ SOLN
INTRAMUSCULAR | Status: DC | PRN
Start: 2016-05-31 — End: 2016-05-31
  Administered 2016-05-31: 4 mg via INTRAVENOUS

## 2016-05-31 MED ORDER — VANCOMYCIN HCL 10 G IV SOLR
1250.0000 mg | Freq: Two times a day (BID) | INTRAVENOUS | Status: DC
  Administered 2016-05-31 – 2016-06-01 (×4): 1250 mg via INTRAVENOUS
  Filled 2016-05-31 (×5): qty 1250

## 2016-05-31 MED ORDER — ONDANSETRON HCL 4 MG PO TABS: 4.0000 mg | ORAL_TABLET | Freq: Four times a day (QID) | ORAL | Status: DC | PRN

## 2016-05-31 MED ORDER — ENOXAPARIN SODIUM 40 MG/0.4ML ~~LOC~~ SOLN
40.0000 mg | SUBCUTANEOUS | Status: DC
  Administered 2016-05-31 – 2016-06-02 (×3): 40 mg via SUBCUTANEOUS
  Filled 2016-05-31 (×3): qty 0.4

## 2016-05-31 MED ORDER — PROPOFOL 10 MG/ML IV BOLUS
INTRAVENOUS | Status: DC | PRN
  Administered 2016-05-31: 150 mg via INTRAVENOUS

## 2016-05-31 MED ORDER — LIDOCAINE HCL (CARDIAC) 20 MG/ML IV SOLN
INTRAVENOUS | Status: DC | PRN
  Administered 2016-05-31: 100 mg via INTRAVENOUS

## 2016-05-31 MED ORDER — PROMETHAZINE HCL 25 MG/ML IJ SOLN: 6.2500 mg | INTRAMUSCULAR | Status: DC | PRN

## 2016-05-31 SURGICAL SUPPLY — 21 items
BLADE SURG SZ11 CARB STEEL (BLADE) ×3 IMPLANT
CANISTER SUCT 1200ML W/VALVE (MISCELLANEOUS) ×3 IMPLANT
DRAIN PENROSE 1/4X12 LTX (DRAIN) IMPLANT
DRAPE LAPAROTOMY 100X77 ABD (DRAPES) ×3 IMPLANT
ELECT REM PT RETURN 9FT ADLT (ELECTROSURGICAL) ×3
ELECTRODE REM PT RTRN 9FT ADLT (ELECTROSURGICAL) ×1 IMPLANT
GAUZE SPONGE 4X4 12PLY STRL (GAUZE/BANDAGES/DRESSINGS) ×3 IMPLANT
GLOVE BIO SURGEON STRL SZ7.5 (GLOVE) ×3 IMPLANT
GLOVE INDICATOR 8.0 STRL GRN (GLOVE) ×3 IMPLANT
GOWN STRL REUS W/ TWL LRG LVL3 (GOWN DISPOSABLE) ×2 IMPLANT
GOWN STRL REUS W/TWL LRG LVL3 (GOWN DISPOSABLE) ×4
KIT RM TURNOVER STRD PROC AR (KITS) ×3 IMPLANT
NDL SAFETY 18GX1.5 (NEEDLE) ×3 IMPLANT
PACK BASIN MINOR ARMC (MISCELLANEOUS) ×3 IMPLANT
PAD ABD DERMACEA PRESS 5X9 (GAUZE/BANDAGES/DRESSINGS) ×6 IMPLANT
SOL PREP PVP 2OZ (MISCELLANEOUS) ×3
SOLUTION PREP PVP 2OZ (MISCELLANEOUS) ×1 IMPLANT
SPONGE LAP 18X18 5 PK (GAUZE/BANDAGES/DRESSINGS) ×3 IMPLANT
SWAB CULTURE AMIES ANAERIB BLU (MISCELLANEOUS) ×3 IMPLANT
SYR BULB EAR ULCER 3OZ GRN STR (SYRINGE) ×3 IMPLANT
SYRINGE 10CC LL (SYRINGE) ×3 IMPLANT

## 2016-05-31 NOTE — Brief Op Note (Signed)
05/30/2016 - 05/31/2016  3:56 PM  PATIENT:  Caitlin Schneider  36 y.o. female  PRE-OPERATIVE DIAGNOSIS:  left arm abscess  POST-OPERATIVE DIAGNOSIS:  left arm abscess  PROCEDURE:  Procedure(s): INCISION AND DRAINAGE ABSCESS LEFT ARM (Left)  SURGEON:  Surgeon(s) and Role:    Ricarda Frame* Novah Nessel, MD - Primary  PHYSICIAN ASSISTANT:   ASSISTANTS: none   ANESTHESIA:   general  EBL:  Total I/O In: 240 [P.O.:240] Out: -   BLOOD ADMINISTERED:none  DRAINS: none   LOCAL MEDICATIONS USED:  NONE  SPECIMEN:  Source of Specimen:  culture of abscess fluid  DISPOSITION OF SPECIMEN:  PATHOLOGY  COUNTS:  YES  TOURNIQUET:  * No tourniquets in log *  DICTATION: .Dragon Dictation  PLAN OF CARE: return to inpatient care  PATIENT DISPOSITION:  PACU - hemodynamically stable.   Delay start of Pharmacological VTE agent (>24hrs) due to surgical blood loss or risk of bleeding: no

## 2016-05-31 NOTE — Plan of Care (Signed)
Problem: Education: Goal: Knowledge of [facility] Education information/materials will improve Outcome: Progressing Pt likes to be called Caitlin Schneider  Past Medical History:  Diagnosis Date  . Asthma   . IV drug user    Pt is not taking any home medications at this moment.

## 2016-05-31 NOTE — Op Note (Signed)
   Pre-operative Diagnosis: Left arm abscess  Post-operative Diagnosis: Same   Procedure performed: Incision and drainage of left arm abscess  Surgeon: Ricarda Frameharles Keishon Chavarin   Assistants: None  Anesthesia: General endotracheal anesthesia  ASA Class: 2  Surgeon: Ricarda Frameharles Louanne Calvillo, MD FACS  Anesthesia: Gen. with endotracheal tube  Assistant: None  Procedure Details  The patient was seen again in the Holding Room. The benefits, complications, treatment options, and expected outcomes were discussed with the patient. The risks of bleeding, infection, recurrence of symptoms, failure to resolve symptoms,  nerve injury, any of which could require further surgery were reviewed with the patient.   The patient was taken to Operating Room, identified as Caitlin GriffithsKelly Schneider and the procedure verified.  A Time Out was held and the above information confirmed.  Prior to the induction of general anesthesia, antibiotic prophylaxis was administered. VTE prophylaxis was in place. General endotracheal anesthesia was then administered and tolerated well. After the induction, the left arm was prepped with Betadine and draped in the sterile fashion. The patient was positioned in the supine position.  The obvious area of infection was sharply entered into with 11 blade scalpel. A culture swab was then inserted into the abscess cavity placed into the redo bypass of the field for culture. The necrotic tissue was easily removed bluntly and with electrocautery. And then using a combination of gentle compression and irrigation copiously amounts of purulent fluid were removed from the area. In the area of obvious bleeding was made hemostatic with electrocautery.  Given the depth of the wound and extension in all directions the decision was made to pack the area. Half-inch iodoform gauze was brought to the field and packed in the wound tightly. The packing in place the drapes were removed and the Betadine was wiped off with a wet and  dry surgical towel. A dressing of plain gauze, ABDs pad, tape was placed over this.  Patient tolerated procedure well. All counts were correct at the end of the procedure. There are no immediate complications. She was awoken from general endotracheal anesthesia and transferred to the PACU in good condition.  Findings: Left arm abscess   Estimated Blood Loss: 10 mL         Drains: None         Specimens: Culture of left arm abscess          Complications: None                  Condition: Good   Ricarda Frameharles Ernie Sagrero, MD, FACS

## 2016-05-31 NOTE — Anesthesia Preprocedure Evaluation (Signed)
Anesthesia Evaluation  Patient identified by MRN, date of birth, ID band Patient awake    Reviewed: Allergy & Precautions, H&P , NPO status , Patient's Chart, lab work & pertinent test results, reviewed documented beta blocker date and time   Airway Mallampati: I  TM Distance: >3 FB Neck ROM: full    Dental  (+) Loose, Poor Dentition, Missing, Dental Advidsory Given   Pulmonary neg shortness of breath, asthma , neg COPD, neg recent URI, Current Smoker,           Cardiovascular Exercise Tolerance: Good negative cardio ROS       Neuro/Psych negative neurological ROS  negative psych ROS   GI/Hepatic GERD  ,(+)     substance abuse  cocaine use, methamphetamine use and IV drug use,   Endo/Other  negative endocrine ROS  Renal/GU negative Renal ROS  negative genitourinary   Musculoskeletal   Abdominal   Peds  Hematology negative hematology ROS (+)   Anesthesia Other Findings Past Medical History: No date: Asthma No date: IV drug user   Reproductive/Obstetrics negative OB ROS                             Anesthesia Physical Anesthesia Plan  ASA: II and emergent  Anesthesia Plan: General ETT, Rapid Sequence and Cricoid Pressure   Post-op Pain Management:    Induction:   Airway Management Planned:   Additional Equipment:   Intra-op Plan:   Post-operative Plan:   Informed Consent: I have reviewed the patients History and Physical, chart, labs and discussed the procedure including the risks, benefits and alternatives for the proposed anesthesia with the patient or authorized representative who has indicated his/her understanding and acceptance.   Dental Advisory Given  Plan Discussed with: Anesthesiologist, CRNA and Surgeon  Anesthesia Plan Comments:         Anesthesia Quick Evaluation

## 2016-05-31 NOTE — Consult Note (Addendum)
Patient ID: Caitlin Schneider, female   DOB: [birth year], 36 y.o.   MRN: [id number]  CC: Left arm abscess  HPI Caitlin Schneider is a 36 y.o. female who is currently admitted to the medicine service for treatment of a left arm infection. Surgery consult requested today by Dr.Sainani for evaluation of an abscess correlating to the left arm infection. Patient admits to being IV drug abuser and states the symptoms started after an injection several days ago. Patient reports the area has been red, raised, hot, painful the last several days going from her left elbow down to her left hand. She does not clearly state what drug she was using to me today. She also cannot clearly state when this last time she used. Her primary complaint is of pain but she's also had a feeling of being febrile from this infection. She denies any current chills, chest pain, shortness of breath, diarrhea, constipation. She is otherwise in her usual state of health  HPI  Past Medical History:  Diagnosis Date  . Asthma   . IV drug user     Past Surgical History:  Procedure Laterality Date  . CESAREAN SECTION      Family History  Problem Relation Age of Onset  . Mother with COPD and DVTs but no diabetes, heart disease, cancer.  Remote family history of pancreatic and lung cancer in a grandparent and uncle.  No other known family history.     Social History Social History  Substance Use Topics  . Smoking status: Current Every Day Smoker    Packs/day: 0.50    Types: Cigarettes  . Smokeless tobacco: Never Used  . Alcohol use No    No Known Allergies  Current Facility-Administered Medications  Medication Dose Route Frequency Provider Last Rate Last Dose  . 0.9 %  sodium chloride infusion   Intravenous Continuous Oralia Manis, MD 75 mL/hr at 05/31/16 0221    . acetaminophen (TYLENOL) tablet 650 mg  650 mg Oral Q6H PRN Oralia Manis, MD       Or  . acetaminophen (TYLENOL) suppository 650 mg  650 mg Rectal Q6H PRN Oralia Manis, MD      . enoxaparin (LOVENOX) injection 40 mg  40 mg Subcutaneous Q24H Oralia Manis, MD   40 mg at 05/31/16 0526  . ondansetron (ZOFRAN) tablet 4 mg  4 mg Oral Q6H PRN Oralia Manis, MD       Or  . ondansetron [facility]) injection 4 mg  4 mg Intravenous Q6H PRN Oralia Manis, MD      . oxyCODONE-acetaminophen (PERCOCET/ROXICET) 5-325 MG per tablet 1-2 tablet  1-2 tablet Oral Q4H PRN Arnaldo Natal, MD   2 tablet at 05/31/16 1049  . piperacillin-tazobactam (ZOSYN) IVPB 3.375 g  3.375 g Intravenous Willow Ora, MD   Stopped at 05/31/16 1100  . [START ON 06/01/2016] pneumococcal 23 valent vaccine (PNU-IMMUNE) injection 0.5 mL  0.5 mL Intramuscular Tomorrow-1000 Oralia Manis, MD      . vancomycin (VANCOCIN) 1,250 mg in sodium chloride 0.9 % 250 mL IVPB  1,250 mg Intravenous Rigoberto Noel, MD   Stopped at 05/31/16 [phone]     Review of Systems A Multi-point review of systems was asked and was negative except for the findings documented in the history of present illness  Physical Exam Blood pressure 104/63, pulse 85, temperature 98.7 F (37.1 C), temperature source Oral, resp. rate 20, height 5\' 2"  (1.575 m), weight 81.6 kg (180 lb), last menstrual period 04/30/2016, SpO2  92 %, unknown if currently breastfeeding. CONSTITUTIONAL: No acute distress. EYES: Pupils are equal, round, and reactive to light, Sclera are non-icteric. EARS, NOSE, MOUTH AND THROAT: The oropharynx is clear. The oral mucosa is pink and moist. Hearing is intact to voice. LYMPH NODES:  Lymph nodes in the neck are normal. RESPIRATORY:  Lungs are clear. There is normal respiratory effort, with equal breath sounds bilaterally, and without pathologic use of accessory muscles. CARDIOVASCULAR: Heart is regular without murmurs, gallops, or rubs. GI: The abdomen is soft, nontender, and nondistended. There are no palpable masses. There is no hepatosplenomegaly. There are normal bowel sounds in all quadrants. GU: Rectal  deferred.   MUSCULOSKELETAL: Normal muscle strength and tone. No cyanosis or edema.   SKIN: Obvious cellulitis extending from the left elbow to the left wrist. Multiple pustules present on the volar aspect of the arm just distal to the elbow. Exquisitely tender to palpation. NEUROLOGIC: Motor and sensation is grossly normal. Cranial nerves are grossly intact. PSYCH:  Oriented to person, place and time. Affect is normal.  Data Reviewed Images and labs reviewed. Labs show a leukocytosis of 15.4, mild hypokalemia of 3.3, urine drug screen shows evidence of opioids, cannabinoids, amphetamines. Ultrasound the upper extremity shows evidence of a 3.3 cm abscess correlating to the area of maximal tenderness. I have personally reviewed the patient's imaging, laboratory findings and medical records.    Assessment    Left upper extremity abscess    Plan    36 year old female with a left upper extremity abscess secondary to known and reported use of IV drugs. Due to the presentation and patient's current symptoms discussed with the patient that an urgent surgical intervention is warranted. The procedure of incision and drainage of the abscess was described in detail to include the possibility of placing a surgical drain versus packing. Discussed that the decision on which to use was determined by the abscess cavity. Patient voiced understanding and desired to have this done as soon as possible. Given the systemic symptoms the patient is currently exhibiting from the infection I discussed this case with anesthesia that we want to emergently proceed. Plan to take to the operating room as soon as the operating room is available. Surgical intervention is considered an emergency secondary to systemic findings of infection from this abscess.     Time spent with the patient was 80 minutes, with more than 50% of the time spent in face-to-face education, counseling and care coordination.     Ricarda Frameharles Shawni Volkov, MD  FACS General Surgeon 05/31/2016, 1:29 PM

## 2016-05-31 NOTE — Progress Notes (Signed)
[facility] [place] [facility]   PATIENT NAME: Caitlin GriffithsKelly Schneider    MR#:  [id number]  DATE OF BIRTH:  1980/06/01  SUBJECTIVE:   Patient here due to left upper extremity cellulitis with abscess formation. Still having significant pain in that left upper extremity with foul-smelling pus drainage.  REVIEW OF SYSTEMS:    Review of Systems  Constitutional: Negative for chills and fever.  HENT: Negative for congestion and tinnitus.   Eyes: Negative for blurred vision and double vision.  Respiratory: Negative for cough, shortness of breath and wheezing.   Cardiovascular: Negative for chest pain, orthopnea and PND.  Gastrointestinal: Negative for abdominal pain, diarrhea, nausea and vomiting.  Genitourinary: Negative for dysuria and hematuria.  Neurological: Negative for dizziness, sensory change and focal weakness.  All other systems reviewed and are negative.   Nutrition: Regular Tolerating Diet: Yes Tolerating PT: Ambulatory   DRUG ALLERGIES:  No Known Allergies  VITALS:  Blood pressure 104/63, pulse 85, temperature 98.7 F (37.1 C), temperature source Oral, resp. rate 20, height 5\' 2"  (1.575 m), weight 81.6 kg (180 lb), last menstrual period 04/30/2016, SpO2 92 %, unknown if currently breastfeeding.  PHYSICAL EXAMINATION:   Physical Exam  GENERAL:  36 y.o.-year-old patient lying in bed in no acute distress.  EYES: Pupils equal, round, reactive to light and accommodation. No scleral icterus. Extraocular muscles intact.  HEENT: Head atraumatic, normocephalic. Oropharynx and nasopharynx clear.  NECK:  Supple, no jugular venous distention. No thyroid enlargement, no tenderness.  LUNGS: Normal breath sounds bilaterally, no wheezing, rales, rhonchi. No use of accessory muscles of respiration.  CARDIOVASCULAR: S1, S2 normal. No murmurs, rubs, or gallops.  ABDOMEN: Soft, nontender, nondistended. Bowel sounds present. No organomegaly or mass.  EXTREMITIES: No  cyanosis, clubbing or edema b/l.  Left upper ext. Swelling redness and fluctuance and with foul smelling Pus drainage noted.    NEUROLOGIC: Cranial nerves II through XII are intact. No focal Motor or sensory deficits b/l.   PSYCHIATRIC: The patient is alert and oriented x 3.  SKIN: No obvious rash, lesion, or ulcer.    LABORATORY PANEL:   CBC  Recent Labs Lab 05/31/16 0205  WBC 15.4*  HGB 15.3  HCT 44.1  PLT 225   ------------------------------------------------------------------------------------------------------------------  Chemistries   Recent Labs Lab 05/30/16 2303 05/31/16 0205  NA 130* 132*  K 3.6 3.3*  CL 96* 102  CO2 19* 20*  GLUCOSE 101* 98  BUN 11 9  CREATININE 1.09* 0.67  CALCIUM 8.9 8.2*  AST 27  --   ALT 32  --   ALKPHOS 85  --   BILITOT 2.6*  --    ------------------------------------------------------------------------------------------------------------------  Cardiac Enzymes No results for input(s): TROPONINI in the last 168 hours. ------------------------------------------------------------------------------------------------------------------  RADIOLOGY:  [place] Lt Upper Extrem Ltd Soft Tissue Non Vascular  Result Date: 05/31/2016 CLINICAL DATA:  Left antecubital abscess.  IV drug use. EXAM: ULTRASOUND LEFT UPPER EXTREMITY LIMITED TECHNIQUE: Ultrasound examination of the upper extremity soft tissues was performed in the area of clinical concern. COMPARISON:  None. FINDINGS: Targeted sonographic evaluation of the area of clinical concern in the left antecubital region demonstrates a heterogeneous 3.3 x 2.1 x 2.4 cm heterogeneous fluid collection in the subcutaneous tissues. No internal blood flow. Some dirty shadowing suggesting internal air. Some flow is noted to the adjacent peripheral soft tissues. IMPRESSION: Abscess in the area of clinical concern measuring 3.3 cm. Electronically Signed   By: Rubye OaksMelanie  Ehinger M.D.   On: 05/31/2016 02:04  ASSESSMENT AND PLAN:   36 year old female with past medical history of substance abuse, IV drug abuse who presents to the hospital due to left upper extremity swelling redness and pain.  1. Left upper extremity cellulitis with abscess - patient will likely need incision and drainage. Notified Dr. Tonita Cong today and he plans to take her OR later today.  - cont. IV Vanc, Zosyn and follow cultures.   2. Leukocytosis - due to # 1.  - follow with IV abx therapy.   3. Hypokalemia - will place on oral Potassium supplements.  - check Mg. Level.  Recheck level in a.m.      All the records are reviewed and case discussed with Care Management/Social Worker. Management plans discussed with the patient, family and they are in agreement.  CODE STATUS: Full code  DVT Prophylaxis: Lovenox  TOTAL TIME TAKING CARE OF THIS PATIENT: 30 minutes.   POSSIBLE D/C IN 2-3 DAYS, DEPENDING ON CLINICAL CONDITION.   Houston Siren M.D on 05/31/2016 at 1:50 PM  Between 7am to 6pm - Pager - [phone]  After 6pm go to [email or web address] - Social research officer, government  [facility]  Office  [phone]  CC: Primary care physician; Patient, No Pcp Per

## 2016-05-31 NOTE — H&P (Signed)
[name] NAME: Caitlin Schneider    MR#:  [id number]  DATE OF BIRTH:  [birth year]  DATE OF ADMISSION:  05/30/2016  PRIMARY CARE PHYSICIAN: Patient, No Pcp Per   REQUESTING/REFERRING PHYSICIAN: Owens Shark, MD  CHIEF COMPLAINT:   Chief Complaint  Patient presents with  . Cellulitis    HISTORY OF PRESENT ILLNESS:  Caitlin Schneider  is a 36 y.o. female who presents with Erythema and pain in her left upper extremity and right hand. Patient engaged in IV drug use recently, developed symptoms in her left arm from a couple of days ago which got progressively worse, develops symptoms in her hand today. Symptoms developed in both cases at site of her IV injection. She met sepsis criteria, and hospitalists were called for admission and further treatment  PAST MEDICAL HISTORY:   Past Medical History:  Diagnosis Date  . Asthma   . IV drug user     PAST SURGICAL HISTORY:   Past Surgical History:  Procedure Laterality Date  . CESAREAN SECTION      SOCIAL HISTORY:   Social History  Substance Use Topics  . Smoking status: Current Every Day Smoker    Packs/day: 0.50    Types: Cigarettes  . Smokeless tobacco: Never Used  . Alcohol use No    FAMILY HISTORY:   Family History  Problem Relation Age of Onset  . Family history unknown: Yes    DRUG ALLERGIES:  No Known Allergies  MEDICATIONS AT HOME:   Prior to Admission medications   Medication Sig Start Date End Date Taking? Authorizing Provider  cyclobenzaprine (FLEXERIL) 10 MG tablet Take 1 tablet (10 mg total) by mouth 3 (three) times daily as needed. Patient not taking: Reported on 05/30/2016 11/30/15   Sable Feil, PA-C  predniSONE (DELTASONE) 10 MG tablet Take 6 tablets  today, on day 2 take 5 tablets, day 3 take 4 tablets, day 4 take 3 tablets, day 5 take  2 tablets and 1 tablet the last day Patient not taking: Reported on 05/30/2016 12/06/15   Johnn Hai, PA-C   traMADol (ULTRAM) 50 MG tablet Take 1 tablet (50 mg total) by mouth every 6 (six) hours as needed. Patient not taking: Reported on 05/30/2016 11/30/15 11/29/16  Sable Feil, PA-C    REVIEW OF SYSTEMS:  Review of Systems  Constitutional: Negative for chills, fever, malaise/fatigue and weight loss.  HENT: Negative for ear pain, hearing loss and tinnitus.   Eyes: Negative for blurred vision, double vision, pain and redness.  Respiratory: Negative for cough, hemoptysis and shortness of breath.   Cardiovascular: Negative for chest pain, palpitations, orthopnea and leg swelling.  Gastrointestinal: Negative for abdominal pain, constipation, diarrhea, nausea and vomiting.  Genitourinary: Negative for dysuria, frequency and hematuria.  Musculoskeletal: Negative for back pain, joint pain and neck pain.  Skin:       Erythema, swelling, pustules left upper extremity and right hand  Neurological: Negative for dizziness, tremors, focal weakness and weakness.  Endo/Heme/Allergies: Negative for polydipsia. Does not bruise/bleed easily.  Psychiatric/Behavioral: Negative for depression. The patient is not nervous/anxious and does not have insomnia.      VITAL SIGNS:   Vitals:   05/30/16 2249 05/30/16 2336  BP: (!) 134/95 128/76  Pulse: (!) 127 (!) 109  Resp: (!) 22 17  Temp: 99.9 F (37.7 C)   TempSrc: Oral   SpO2: 98% 96%  Weight: 81.6 kg (180 lb)   Height:  '5\' 2"'  (1.575 m)    Wt Readings from Last 3 Encounters:  05/30/16 81.6 kg (180 lb)  04/03/16 68 kg (150 lb)  12/06/15 86.2 kg (190 lb)    PHYSICAL EXAMINATION:  Physical Exam  Vitals reviewed. Constitutional: She is oriented to person, place, and time. She appears well-developed and well-nourished. No distress.  HENT:  Head: Normocephalic and atraumatic.  Mouth/Throat: Oropharynx is clear and moist.  Eyes: Conjunctivae and EOM are normal. Pupils are equal, round, and reactive to light. No scleral icterus.  Neck: Normal range of  motion. Neck supple. No JVD present. No thyromegaly present.  Cardiovascular: Normal rate, regular rhythm and intact distal pulses.  Exam reveals no gallop and no friction rub.   No murmur heard. Respiratory: Effort normal and breath sounds normal. No respiratory distress. She has no wheezes. She has no rales.  GI: Soft. Bowel sounds are normal. She exhibits no distension. There is no tenderness.  Musculoskeletal: Normal range of motion. She exhibits no edema.  No arthritis, no gout  Lymphadenopathy:    She has no cervical adenopathy.  Neurological: She is alert and oriented to person, place, and time. No cranial nerve deficit.  No dysarthria, no aphasia  Skin: Skin is warm and dry. No rash noted. There is erythema (With swelling and some pustule formation on left upper extremity and right hand).  Psychiatric: She has a normal mood and affect. Her behavior is normal. Judgment and thought content normal.    LABORATORY PANEL:   CBC  Recent Labs Lab 05/30/16 2303  WBC 18.2*  HGB 16.8*  HCT 47.3*  PLT 261   ------------------------------------------------------------------------------------------------------------------  Chemistries   Recent Labs Lab 05/30/16 2303  NA 130*  K 3.6  CL 96*  CO2 19*  GLUCOSE 101*  BUN 11  CREATININE 1.09*  CALCIUM 8.9  AST 27  ALT 32  ALKPHOS 85  BILITOT 2.6*   ------------------------------------------------------------------------------------------------------------------  Cardiac Enzymes No results for input(s): TROPONINI in the last 168 hours. ------------------------------------------------------------------------------------------------------------------  RADIOLOGY:  No results found.  EKG:   Orders placed or performed during the hospital encounter of 05/30/16  . ED EKG 12-Lead  . ED EKG 12-Lead  . EKG 12-Lead  . EKG 12-Lead    IMPRESSION AND PLAN:  Principal Problem:   Sepsis ([place]) - due to cellulitis, she is  hemodynamically stable, IV antibiotics in place, cultures sent from the ED Active Problems:   Cellulitis - due to IV drug use, cultures as above, IV antibiotics as above. Ultrasound pending to check for possible abscess formation, if found then would need surgery consult for incision and drainage   IV drug user - urine tox screen pending  All the records are reviewed and case discussed with ED provider. Management plans discussed with the patient and/or family.  DVT PROPHYLAXIS: SubQ lovenox  GI PROPHYLAXIS: None  ADMISSION STATUS: Inpatient  CODE STATUS: Full Code Status History    This patient does not have a recorded code status. Please follow your organizational policy for patients in this situation.      TOTAL TIME TAKING CARE OF THIS PATIENT: 45 minutes.   Caitlin Schneider 05/31/2016, 12:20 AM  [facility]  Office  [phone]  CC: Primary care physician; Patient, No Pcp Per  Note:  This document was prepared using Dragon voice recognition software and may include unintentional dictation errors.

## 2016-05-31 NOTE — Progress Notes (Signed)
Pharmacy Antibiotic Note  Caitlin Schneider is a 36 y.o. female admitted on 05/30/2016 with sepsis secondary to arm cellulitis secondary to IVDA.  Pharmacy has been consulted for Vanc/zosyn dosing.  Plan: Patient received vanc 1g IV x 1 in the ED  Will follow-up w/ vanc maintenance of 1.25g IV q12h w/ 6 hour stack dose. Will draw vanc trough 5/10 @ 1630 prior to 4th dose. Will start zosyn 3.375g IV q8h extended infusion. Ke 0.0635 t1/2 10 hrs~ 12 hours  Height: 5\' 2"  (157.5 cm) Weight: 180 lb (81.6 kg) IBW/kg (Calculated) : 50.1  Temp (24hrs), Avg:99.9 F (37.7 C), Min:99.9 F (37.7 C), Max:99.9 F (37.7 C)   Recent Labs Lab 05/30/16 2303  WBC 18.2*  CREATININE 1.09*  LATICACIDVEN 1.0    Estimated Creatinine Clearance: 71.3 mL/min (A) (by C-G formula based on SCr of 1.09 mg/dL (H)).    No Known Allergies   Thank you for allowing pharmacy to be a part of this patient's care.  Thomasene Rippleavid Rogers Ditter, PharmD, BCPS Clinical Pharmacist 05/31/2016

## 2016-05-31 NOTE — Progress Notes (Signed)
Pharmacy Antibiotic Note  Caitlin Schneider is a 36 y.o. female admitted on 05/30/2016 with sepsis secondary to arm cellulitis secondary to IVDA.  Pharmacy has been consulted for Vanc/zosyn dosing.  Plan: Patient received vanc 1g IV x 1 in the ED  Will follow-up w/ vanc maintenance of 1.25g IV q12h w/ 6 hour stack dose. Will draw vanc trough 5/10 @ 1630 prior to 4th dose.  Will start zosyn 3.375g IV q8h extended infusion. Ke 0.0635 t1/2 10 hrs~ 12 hours  5/9:  Patient with abscess per Ultrasound. Goal Vancomycin trough= 15-20 mcg/ml. Scr improved. PK values still similar (using 0.8 for Scr, Adjusted Body wt= 62.7 kg). Continue current regimen and assess Vancomycin level tomorrow.     Height: 5\' 2"  (157.5 cm) Weight: 180 lb (81.6 kg) IBW/kg (Calculated) : 50.1  Temp (24hrs), Avg:99.6 F (37.6 C), Min:98.7 F (37.1 C), Max:100.9 F (38.3 C)   Recent Labs Lab 05/30/16 2303 05/31/16 0205  WBC 18.2* 15.4*  CREATININE 1.09* 0.67  LATICACIDVEN 1.0 0.8    Estimated Creatinine Clearance: 97.2 mL/min (by C-G formula based on SCr of 0.67 mg/dL).    No Known Allergies   Thank you for allowing pharmacy to be a part of this patient's care.  Caitlin MantisKristin Arshad Schneider PharmD Clinical Pharmacist 05/31/2016

## 2016-05-31 NOTE — Anesthesia Procedure Notes (Addendum)
Procedure Name: Intubation Date/Time: 05/31/2016 3:30 PM Performed by: Doreen Salvage Pre-anesthesia Checklist: Patient identified, Emergency Drugs available, Suction available and Patient being monitored Patient Re-evaluated:Patient Re-evaluated prior to inductionOxygen Delivery Method: Circle system utilized Preoxygenation: Pre-oxygenation with 100% oxygen Intubation Type: IV induction, Cricoid Pressure applied and Rapid sequence Ventilation: Mask ventilation without difficulty Laryngoscope Size: Mac and 3 Grade View: Grade II Tube type: Oral Tube size: 7.0 mm Number of attempts: 1 Airway Equipment and Method: Stylet Placement Confirmation: ETT inserted through vocal cords under direct vision,  positive ETCO2 and breath sounds checked- equal and bilateral Secured at: 21 cm Tube secured with: Tape Dental Injury: Teeth and Oropharynx as per pre-operative assessment

## 2016-05-31 NOTE — Transfer of Care (Signed)
Immediate Anesthesia Transfer of Care Note  Patient: Caitlin GriffithsKelly Jilek  Procedure(s) Performed: Procedure(s): INCISION AND DRAINAGE ABSCESS LEFT ARM (Left)  Patient Location: PACU  Anesthesia Type:General  Level of Consciousness: sedated  Airway & Oxygen Therapy: Patient Spontanous Breathing and Patient connected to face mask oxygen  Post-op Assessment: Report given to RN and Post -op Vital signs reviewed and stable  Post vital signs: Reviewed and stable  Last Vitals:  Vitals:   05/31/16 0754 05/31/16 1605  BP: 104/63 120/68  Pulse: 85 97  Resp: 20 (!) 25  Temp: 37.1 C 37 C    Complications: No apparent anesthesia complications

## 2016-05-31 NOTE — Anesthesia Post-op Follow-up Note (Cosign Needed)
Anesthesia QCDR form completed.        

## 2016-05-31 NOTE — ED Notes (Signed)
Admitting MD at bedside.

## 2016-06-01 ENCOUNTER — Encounter: Payer: Self-pay | Admitting: General Surgery

## 2016-06-01 LAB — CBC
HCT: 41 % (ref 35.0–47.0)
HEMOGLOBIN: 14 g/dL (ref 12.0–16.0)
MCH: 29.5 pg (ref 26.0–34.0)
MCHC: 34 g/dL (ref 32.0–36.0)
MCV: 86.7 fL (ref 80.0–100.0)
PLATELETS: 232 10*3/uL (ref 150–440)
RBC: 4.73 MIL/uL (ref 3.80–5.20)
RDW: 14.1 % (ref 11.5–14.5)
WBC: 11.2 10*3/uL — AB (ref 3.6–11.0)

## 2016-06-01 LAB — POTASSIUM: Potassium: 4.4 mmol/L (ref 3.5–5.1)

## 2016-06-01 LAB — CREATININE, SERUM
Creatinine, Ser: 0.44 mg/dL (ref 0.44–1.00)
GFR calc Af Amer: >60 mL/min (ref >60)

## 2016-06-01 LAB — VANCOMYCIN, TROUGH: Vancomycin Tr: 11 ug/mL — ABNORMAL LOW (ref 15–20)

## 2016-06-01 LAB — HIV ANTIBODY (ROUTINE TESTING W REFLEX): HIV SCREEN 4TH GENERATION: NONREACTIVE

## 2016-06-01 MED ORDER — VANCOMYCIN HCL 10 G IV SOLR
1500.0000 mg | Freq: Two times a day (BID) | INTRAVENOUS | Status: DC
  Administered 2016-06-02: 07:00:00 1500 mg via INTRAVENOUS
  Filled 2016-06-01 (×3): qty 1500

## 2016-06-01 MED ORDER — VANCOMYCIN HCL IN DEXTROSE 1-5 GM/200ML-% IV SOLN
1000.0000 mg | Freq: Three times a day (TID) | INTRAVENOUS | Status: DC
  Filled 2016-06-01 (×3): qty 200

## 2016-06-01 NOTE — Progress Notes (Signed)
[facility] [place] [facility]   PATIENT NAME: Caitlin Schneider    MR#:  [id number]  DATE OF BIRTH:  12/05/1980  SUBJECTIVE:   Patient here due to left upper extremity cellulitis with abscess formation. s/p I & D done by surgery yesterday and feels better.  Afebrile, WBC count trending down.   REVIEW OF SYSTEMS:    Review of Systems  Constitutional: Negative for chills and fever.  HENT: Negative for congestion and tinnitus.   Eyes: Negative for blurred vision and double vision.  Respiratory: Negative for cough, shortness of breath and wheezing.   Cardiovascular: Negative for chest pain, orthopnea and PND.  Gastrointestinal: Negative for abdominal pain, diarrhea, nausea and vomiting.  Genitourinary: Negative for dysuria and hematuria.  Neurological: Negative for dizziness, sensory change and focal weakness.  All other systems reviewed and are negative.   Nutrition: Regular Tolerating Diet: Yes Tolerating PT: Ambulatory   DRUG ALLERGIES:  No Known Allergies  VITALS:  Blood pressure (!) 126/52, pulse (!) 48, temperature 97.7 F (36.5 C), temperature source Oral, resp. rate 20, height 5\' 2"  (1.575 m), weight 81.6 kg (180 lb), last menstrual period 04/30/2016, SpO2 97 %, unknown if currently breastfeeding.  PHYSICAL EXAMINATION:   Physical Exam  GENERAL:  36 y.o.-year-old patient lying in bed in no acute distress.  EYES: Pupils equal, round, reactive to light and accommodation. No scleral icterus. Extraocular muscles intact.  HEENT: Head atraumatic, normocephalic. Oropharynx and nasopharynx clear.  NECK:  Supple, no jugular venous distention. No thyroid enlargement, no tenderness.  LUNGS: Normal breath sounds bilaterally, no wheezing, rales, rhonchi. No use of accessory muscles of respiration.  CARDIOVASCULAR: S1, S2 normal. No murmurs, rubs, or gallops.  ABDOMEN: Soft, nontender, nondistended. Bowel sounds present. No organomegaly or mass.  EXTREMITIES:  No cyanosis, clubbing or edema b/l.  Left upper ext. Swelling redness and fluctuance and now s/p I & D with dressing placed on it.   NEUROLOGIC: Cranial nerves II through XII are intact. No focal Motor or sensory deficits b/l.   PSYCHIATRIC: The patient is alert and oriented x 3.  SKIN: No obvious rash, lesion, or ulcer.    LABORATORY PANEL:   CBC  Recent Labs Lab 06/01/16 0559  WBC 11.2*  HGB 14.0  HCT 41.0  PLT 232   ------------------------------------------------------------------------------------------------------------------  Chemistries   Recent Labs Lab 05/30/16 2303 05/31/16 0205 06/01/16 0559  NA 130* 132*  --   K 3.6 3.3* 4.4  CL 96* 102  --   CO2 19* 20*  --   GLUCOSE 101* 98  --   BUN 11 9  --   CREATININE 1.09* 0.67 0.44  CALCIUM 8.9 8.2*  --   MG  --  1.6*  --   AST 27  --   --   ALT 32  --   --   ALKPHOS 85  --   --   BILITOT 2.6*  --   --    ------------------------------------------------------------------------------------------------------------------  Cardiac Enzymes No results for input(s): TROPONINI in the last 168 hours. ------------------------------------------------------------------------------------------------------------------  RADIOLOGY:  [place] Lt Upper Extrem Ltd Soft Tissue Non Vascular  Result Date: 05/31/2016 CLINICAL DATA:  Left antecubital abscess.  IV drug use. EXAM: ULTRASOUND LEFT UPPER EXTREMITY LIMITED TECHNIQUE: Ultrasound examination of the upper extremity soft tissues was performed in the area of clinical concern. COMPARISON:  None. FINDINGS: Targeted sonographic evaluation of the area of clinical concern in the left antecubital region demonstrates a heterogeneous 3.3 x 2.1 x 2.4  cm heterogeneous fluid collection in the subcutaneous tissues. No internal blood flow. Some dirty shadowing suggesting internal air. Some flow is noted to the adjacent peripheral soft tissues. IMPRESSION: Abscess in the area of clinical concern  measuring 3.3 cm. Electronically Signed   By: Rubye OaksMelanie  Ehinger M.D.   On: 05/31/2016 02:04     ASSESSMENT AND PLAN:   36 year old female with past medical history of substance abuse, IV drug abuse who presents to the hospital due to left upper extremity swelling redness and pain.  1. Left upper extremity cellulitis with abscess - Status post incision and drainage done by surgery yesterday. -Continue broad-spectrum IV antibiotics with vancomycin, Zosyn. Follow intraoperative cultures. As per surgery can likely be transitioned to oral antibiotics tomorrow.  2. Leukocytosis - due to # 1.  -Improving with IV antibiotics.  3. Hypokalemia - improved and resolved with supplementation.    All the records are reviewed and case discussed with Care Management/Social Worker. Management plans discussed with the patient, family and they are in agreement.  CODE STATUS: Full code  DVT Prophylaxis: Lovenox  TOTAL TIME TAKING CARE OF THIS PATIENT: 25 minutes.   POSSIBLE D/C IN 1-2 DAYS, DEPENDING ON CLINICAL CONDITION.   Houston SirenSAINANI,VIVEK J M.D on 06/01/2016 at 12:59 PM  Between 7am to 6pm - Pager - [phone]  After 6pm go to [email or web address] - Social research officer, governmentpassword [email or web address]  [facility] [place]  Office  [phone]  CC: Primary care physician; Patient, No Pcp Per

## 2016-06-01 NOTE — Progress Notes (Signed)
1 Day Post-Op   Subjective:  Patient underwent I&D of left arm abscess secondary to IV drug abuse yesterday. She reports the area feels better than it did but is still painful. She is tolerating a diet and feeling better.  Vital signs in last 24 hours: Temp:  [97.6 F (36.4 C)-98.6 F (37 C)] 97.6 F (36.4 C) (05/10 0355) Pulse Rate:  [59-97] 59 (05/10 0355) Resp:  [16-25] 20 (05/10 0355) BP: (102-137)/(59-70) 129/69 (05/10 0355) SpO2:  [94 %-99 %] 99 % (05/10 0355) Last BM Date: 06/01/16  Intake/Output from previous day: 05/09 0701 - 05/10 0700 In: 1040 [P.O.:240; I.V.:400; IV Piggyback:400] Out: 500 [Urine:500]  Physical exam: Gen.: No acute distress Chest: Clear to auscultation Heart: Regular rhythm GI: soft, non-tender; bowel sounds normal; no masses,  no organomegaly  Extremity: Left upper extremity examined. Dressing taken down to reveal the I&D site. The erythema has been rapidly retreating but continues to extend proximally 6 cm in each direction from the incision site. There is no purulent drainage noted on the dressing.  Lab Results:  CBC  Recent Labs  05/31/16 0205 06/01/16 0559  WBC 15.4* 11.2*  HGB 15.3 14.0  HCT 44.1 41.0  PLT 225 232   CMP     Component Value Date/Time   NA 132 (L) 05/31/2016 0205   K 4.4 06/01/2016 0559   CL 102 05/31/2016 0205   CO2 20 (L) 05/31/2016 0205   GLUCOSE 98 05/31/2016 0205   BUN 9 05/31/2016 0205   CREATININE 0.44 06/01/2016 0559   CALCIUM 8.2 (L) 05/31/2016 0205   PROT 7.9 05/30/2016 2303   ALBUMIN 4.0 05/30/2016 2303   AST 27 05/30/2016 2303   ALT 32 05/30/2016 2303   ALKPHOS 85 05/30/2016 2303   BILITOT 2.6 (H) 05/30/2016 2303   GFRNONAA >60 06/01/2016 0559   GFRAA >60 06/01/2016 0559   PT/INR No results for input(s): LABPROT, INR in the last 72 hours.  Studies/Results: [place] Lt Upper Extrem Ltd Soft Tissue Non Vascular  Result Date: 05/31/2016 CLINICAL DATA:  Left antecubital abscess.  IV drug use. EXAM:  ULTRASOUND LEFT UPPER EXTREMITY LIMITED TECHNIQUE: Ultrasound examination of the upper extremity soft tissues was performed in the area of clinical concern. COMPARISON:  None. FINDINGS: Targeted sonographic evaluation of the area of clinical concern in the left antecubital region demonstrates a heterogeneous 3.3 x 2.1 x 2.4 cm heterogeneous fluid collection in the subcutaneous tissues. No internal blood flow. Some dirty shadowing suggesting internal air. Some flow is noted to the adjacent peripheral soft tissues. IMPRESSION: Abscess in the area of clinical concern measuring 3.3 cm. Electronically Signed   By: Rubye OaksMelanie  Ehinger M.D.   On: 05/31/2016 02:04    Assessment/Plan: 36 year old female status post I&D of a left arm abscess from IV drug abuse. Discussed with the patient that I would recommend continuing IV antibiotics for 1 additional day while we wait for culture results. She would then need to be transitioned to oral antibiotics for a 2 week course. The wound is currently packed. Discussed that the packing will be gradually removed and cut each day. She will need twice daily dressing changes over this until the drainage decreases. Encourage ambulation and incentive spirometer usage. Surgery will continue to follow with you.   Ricarda Frameharles Shenekia Riess, MD [facility] General Surgeon [facility]  Day [facility] [phone](7a-7p) [phone] Night [facility] [phone](7p-[phone]  06/01/2016

## 2016-06-01 NOTE — Progress Notes (Addendum)
Pharmacy Antibiotic Note  Caitlin GriffithsKelly Schneider is a 36 y.o. female admitted on 05/30/2016 with sepsis secondary to arm cellulitis secondary to IVDA.  Pharmacy has been consulted for Vanc/zosyn dosing. 5/9:  Patient with abscess per Ultrasound.  5/10  Pt has been receiving vancomycin 1250 mg IV q 12 hours. Per MD note, patient is clinically improving may be transitioned to PO therapy tomorrow  Plan: 05/10 VT 1700 = 11 (goal trough 15-20) Will transition patient from vancomycin 1250 IV q12h to vancomycin 1500 mg q 12 hours  New PK: t/12 7.3 Vd = 57 Ke = 0.095 Will check trough prior to 4th new dose  Height: 5\' 2"  (157.5 cm) Weight: 180 lb (81.6 kg) IBW/kg (Calculated) : 50.1  Temp (24hrs), Avg:97.8 F (36.6 C), Min:97.6 F (36.4 C), Max:98.2 F (36.8 C)   Recent Labs Lab 05/30/16 2303 05/31/16 0205 06/01/16 0559 06/01/16 1653  WBC 18.2* 15.4* 11.2*  --   CREATININE 1.09* 0.67 0.44  --   LATICACIDVEN 1.0 0.8  --   --   VANCOTROUGH  --   --   --  11*    Estimated Creatinine Clearance: 97.2 mL/min (by C-G formula based on SCr of 0.44 mg/dL).    No Known Allergies  Thank you for allowing pharmacy to be a part of this patient's care.  Horris LatinoHolly Gilliam, PharmD Pharmacy Resident 06/01/2016 6:58 PM

## 2016-06-01 NOTE — Anesthesia Postprocedure Evaluation (Signed)
Anesthesia Post Note  Patient: Caitlin Schneider  Procedure(s) Performed: Procedure(s) (LRB): INCISION AND DRAINAGE ABSCESS LEFT ARM (Left)  Patient location during evaluation: PACU Anesthesia Type: General Level of consciousness: awake and alert Pain management: pain level controlled Vital Signs Assessment: post-procedure vital signs reviewed and stable Respiratory status: spontaneous breathing, nonlabored ventilation, respiratory function stable and patient connected to nasal cannula oxygen Cardiovascular status: blood pressure returned to baseline and stable Postop Assessment: no signs of nausea or vomiting Anesthetic complications: no     Last Vitals:  Vitals:   05/31/16 1941 06/01/16 0355  BP: 137/68 129/69  Pulse: 93 (!) 59  Resp: 20 20  Temp: 36.8 C 36.4 C    Last Pain:  Vitals:   06/01/16 0359  TempSrc:   PainSc: 2                  Lenard SimmerAndrew Eriyah Fernando

## 2016-06-01 NOTE — Care Management (Signed)
Admitted to this facility with diagnosis of sepsis. (left upper extremity cellulitis)  Lives with mother, Susa Raringvelyn Lender, [phone]). [place]. [place]. States she does have a picture identification, but not with her. No primary care physician. No income, not employed. Takes care of all basic activities of daily living herself, drives. Appetite is So-So. No falls. [facility].  Discussed the [facility] and Medication Management application Gwenette GreetBrenda S Beyza Bellino RN MSN CCM Care Management [phone]

## 2016-06-02 LAB — HEPATITIS PANEL, ACUTE
Hep A IgM: NEGATIVE
Hep B C IgM: NEGATIVE
Hepatitis B Surface Ag: NEGATIVE

## 2016-06-02 LAB — CBC
HCT: 39 % (ref 35.0–47.0)
Hemoglobin: 13.4 g/dL (ref 12.0–16.0)
MCH: 30.5 pg (ref 26.0–34.0)
MCHC: 34.3 g/dL (ref 32.0–36.0)
MCV: 88.8 fL (ref 80.0–100.0)
Platelets: 254 10*3/uL (ref 150–440)
RBC: 4.39 MIL/uL (ref 3.80–5.20)
RDW: 14.5 % (ref 11.5–14.5)
WBC: 11.7 10*3/uL — ABNORMAL HIGH (ref 3.6–11.0)

## 2016-06-02 LAB — MRSA PCR SCREENING: MRSA BY PCR: NEGATIVE

## 2016-06-02 MED ORDER — AMOXICILLIN-POT CLAVULANATE 875-125 MG PO TABS: 1.0000 | ORAL_TABLET | Freq: Two times a day (BID) | ORAL | 0 refills | Status: AC

## 2016-06-02 NOTE — Discharge Summary (Signed)
[facility] - [place] [facility]   PATIENT NAME: Caitlin Schneider    MR#:  [id number]  DATE OF BIRTH:  [birth year]  DATE OF ADMISSION:  05/30/2016 ADMITTING PHYSICIAN: Oralia Manis, MD  DATE OF DISCHARGE: 06/02/2016  PRIMARY CARE PHYSICIAN: Patient, No Pcp Per    ADMISSION DIAGNOSIS:  Abscess [L02.91] Left arm cellulitis [L03.114] Sepsis, due to unspecified organism (HCC) [A41.9]  DISCHARGE DIAGNOSIS:  Principal Problem:   Sepsis (HCC) Active Problems:   Cellulitis   IV drug user   Left arm cellulitis   Abscess   SECONDARY DIAGNOSIS:   Past Medical History:  Diagnosis Date  . Asthma   . IV drug user     HOSPITAL COURSE:   36 year old female with past medical history of substance abuse, IV drug abuse who presents to the hospital due to left upper extremity swelling redness and pain.  1. Left upper extremity cellulitis with abscess - Status post incision and drainage done by surgery postop day #2 -Patient was treated with broad-spectrum IV antibiotics with vancomycin and Zosyn. Cultures so far have not been identified but she has  Clinically improved. Her white cell count has normal, she is afebrile. -She is being discharged on oral Augmentin for an additional 14 days with follow-up with general surgeryin the next 2 weeks. -she will continue local wound care to left upper ext.   2. Leukocytosis - due to # 1.  - improved and normalized with IV abx.   3. Hypokalemia - improved and resolved with supplementation.  DISCHARGE CONDITIONS:   Stable.   CONSULTS OBTAINED:  Treatment Team:  Ricarda Frame, MD  DRUG ALLERGIES:  No Known Allergies  DISCHARGE MEDICATIONS:   Allergies as of 06/02/2016   No Known Allergies     Medication List    STOP taking these medications   cyclobenzaprine 10 MG tablet Commonly known as:  FLEXERIL   predniSONE 10 MG tablet Commonly known as:  DELTASONE   traMADol 50 MG tablet Commonly known as:  ULTRAM      TAKE these medications   amoxicillin-clavulanate 875-125 MG tablet Commonly known as:  AUGMENTIN Take 1 tablet by mouth 2 (two) times daily.         DISCHARGE INSTRUCTIONS:   DIET:  Regular diet  DISCHARGE CONDITION:  Stable  ACTIVITY:  Activity as tolerated  OXYGEN:  Home Oxygen: No.   Oxygen Delivery: room air  DISCHARGE LOCATION:  home   If you experience worsening of your admission symptoms, develop shortness of breath, life threatening emergency, suicidal or homicidal thoughts you must seek medical attention immediately by calling 911 or calling your MD immediately  if symptoms less severe.  You Must read complete instructions/literature along with all the possible adverse reactions/side effects for all the Medicines you take and that have been prescribed to you. Take any new Medicines after you have completely understood and accpet all the possible adverse reactions/side effects.   Please note  You were cared for by a hospitalist during your hospital stay. If you have any questions about your discharge medications or the care you received while you were in the hospital after you are discharged, you can call the unit and asked to speak with the hospitalist on call if the hospitalist that took care of you is not available. Once you are discharged, your primary care physician will handle any further medical issues. Please note that NO REFILLS for any discharge medications will be authorized once you are discharged, as it  is imperative that you return to your primary care physician (or establish a relationship with a primary care physician if you do not have one) for your aftercare needs so that they can reassess your need for medications and monitor your lab values.     Today   Still having some left upper ext. Pain. Overall pain and swelling in left upper ext. Has improved.   VITAL SIGNS:  Blood pressure (!) 102/53, pulse (!) 52, temperature 97.8 F (36.6 C),  temperature source Oral, resp. rate 20, height 5\' 2"  (1.575 m), weight 81.6 kg (180 lb), last menstrual period 04/30/2016, SpO2 97 %, unknown if currently breastfeeding.  I/O:   Intake/Output Summary (Last 24 hours) at 06/02/16 1341 Last data filed at 06/02/16 [id number]  Gross per 24 hour  Intake             1060 ml  Output                0 ml  Net             1060 ml    PHYSICAL EXAMINATION:   GENERAL:  36 y.o.-year-old patient lying in bed in no acute distress.  EYES: Pupils equal, round, reactive to light and accommodation. No scleral icterus. Extraocular muscles intact.  HEENT: Head atraumatic, normocephalic. Oropharynx and nasopharynx clear.  NECK:  Supple, no jugular venous distention. No thyroid enlargement, no tenderness.  LUNGS: Normal breath sounds bilaterally, no wheezing, rales, rhonchi. No use of accessory muscles of respiration.  CARDIOVASCULAR: S1, S2 normal. No murmurs, rubs, or gallops.  ABDOMEN: Soft, nontender, nondistended. Bowel sounds present. No organomegaly or mass.  EXTREMITIES: No cyanosis, clubbing or edema b/l.  Left upper ext. Swelling redness much improved and now s/p I & D with dressing placed on it.   NEUROLOGIC: Cranial nerves II through XII are intact. No focal Motor or sensory deficits b/l.   PSYCHIATRIC: The patient is alert and oriented x 3.  SKIN: No obvious rash, lesion, or ulcer.   DATA REVIEW:   CBC  Recent Labs Lab 06/02/16 0555  WBC 11.7*  HGB 13.4  HCT 39.0  PLT 254    Chemistries   Recent Labs Lab 05/30/16 2303 05/31/16 0205 06/01/16 0559  NA 130* 132*  --   K 3.6 3.3* 4.4  CL 96* 102  --   CO2 19* 20*  --   GLUCOSE 101* 98  --   BUN 11 9  --   CREATININE 1.09* 0.67 0.44  CALCIUM 8.9 8.2*  --   MG  --  1.6*  --   AST 27  --   --   ALT 32  --   --   ALKPHOS 85  --   --   BILITOT 2.6*  --   --     Cardiac Enzymes No results for input(s): TROPONINI in the last 168 hours.  Microbiology Results  Results for orders  placed or performed during the hospital encounter of 05/30/16  Blood Culture (routine x 2)     Status: None (Preliminary result)   Collection Time: 05/30/16 11:14 PM  Result Value Ref Range Status   Specimen Description BLOOD RIGHT WRIST  Final   Special Requests   Final    BOTTLES DRAWN AEROBIC AND ANAEROBIC Blood Culture adequate volume   Culture NO GROWTH 3 DAYS  Final   Report Status PENDING  Incomplete  Blood Culture (routine x 2)     Status: None (Preliminary result)  Collection Time: 05/30/16 11:14 PM  Result Value Ref Range Status   Specimen Description BLOOD RIGHT AC  Final   Special Requests   Final    BOTTLES DRAWN AEROBIC AND ANAEROBIC Blood Culture adequate volume   Culture NO GROWTH 3 DAYS  Final   Report Status PENDING  Incomplete  Aerobic Culture (superficial specimen)     Status: None (Preliminary result)   Collection Time: 05/31/16  3:41 PM  Result Value Ref Range Status   Specimen Description ABSCESS  Final   Special Requests NONE  Final   Gram Stain   Final    MODERATE WBC PRESENT, PREDOMINANTLY PMN ABUNDANT GRAM POSITIVE COCCI IN PAIRS FEW GRAM NEGATIVE RODS RARE GRAM POSITIVE RODS    Culture   Final    CULTURE REINCUBATED FOR BETTER GROWTH Performed at [facility], [address] [place], [place] [address]    Report Status PENDING  Incomplete  MRSA PCR Screening     Status: None   Collection Time: 06/02/16 10:20 AM  Result Value Ref Range Status   MRSA by PCR NEGATIVE NEGATIVE Final    Comment:        The GeneXpert MRSA Assay (FDA approved for NASAL specimens only), is one component of a comprehensive MRSA colonization surveillance program. It is not intended to diagnose MRSA infection nor to guide or monitor treatment for MRSA infections.     RADIOLOGY:  No results found.    Management plans discussed with the patient, family and they are in agreement.  CODE STATUS:     Code Status Orders        Start     Ordered    05/31/16 0146  Full code  Continuous     05/31/16 0146    Code Status History    Date Active Date Inactive Code Status Order ID Comments User Context   This patient has a current code status but no historical code status.      TOTAL TIME TAKING CARE OF THIS PATIENT: 40 minutes.    Houston Siren M.D on 06/02/2016 at 1:41 PM  Between 7am to 6pm - Pager - [phone]  After 6pm go to [email or web address] - Social research officer, government  [facility] [place] [facility]  Office  [phone]  CC: Primary care physician; Patient, No Pcp Per

## 2016-06-02 NOTE — Progress Notes (Signed)
2 Days Post-Op   Subjective:  Patient remains on the floor with dressing in place. Continues to complain of pain. Has been improving and tolerating a diet.  Vital signs in last 24 hours: Temp:  [97.7 F (36.5 C)-98 F (36.7 C)] 97.8 F (36.6 C) (05/11 0409) Pulse Rate:  [48-55] 52 (05/11 0409) Resp:  [20] 20 (05/11 0409) BP: (98-126)/(52-55) 102/53 (05/11 0409) SpO2:  [97 %] 97 % (05/11 0409) Last BM Date: 06/02/16  Intake/Output from previous day: 05/10 0701 - 05/11 0700 In: 1420 [P.O.:720; IV Piggyback:700] Out: -   GI: soft, non-tender; bowel sounds normal; no masses,  no organomegaly  Extremity: Left upper extremity dressing taken down. Packing partially removed and trimmed with scissors. Area redressed with plain gauze. Previously noted erythema appears to completely resolved.  Lab Results:  CBC  Recent Labs  06/01/16 0559 06/02/16 0555  WBC 11.2* 11.7*  HGB 14.0 13.4  HCT 41.0 39.0  PLT 232 254   CMP     Component Value Date/Time   NA 132 (L) 05/31/2016 0205   K 4.4 06/01/2016 0559   CL 102 05/31/2016 0205   CO2 20 (L) 05/31/2016 0205   GLUCOSE 98 05/31/2016 0205   BUN 9 05/31/2016 0205   CREATININE 0.44 06/01/2016 0559   CALCIUM 8.2 (L) 05/31/2016 0205   PROT 7.9 05/30/2016 2303   ALBUMIN 4.0 05/30/2016 2303   AST 27 05/30/2016 2303   ALT 32 05/30/2016 2303   ALKPHOS 85 05/30/2016 2303   BILITOT 2.6 (H) 05/30/2016 2303   GFRNONAA >60 06/01/2016 0559   GFRAA >60 06/01/2016 0559   PT/INR No results for input(s): LABPROT, INR in the last 72 hours.  Studies/Results: No results found.  Assessment/Plan: 36 year old female status post I&D of a left upper extremity abscess. Wound care instructions and how to slowly remove packing was shown to the patient and her significant other at the bedside. They both voiced understanding and believe they can do it at home. Discussed with the primary team that once an oral antibiotic agent can be chosen that it is  okay for discharge from a surgical standpoint. Patient will need follow-up in clinic for additional wound check in 10-14 days.   Ricarda Frameharles Aarya Quebedeaux, MD [facility] General Surgeon [facility]  Day [facility] [phone]-[phone]) [phone] Night [facility] [phone](7p-7a) [phone]  06/02/2016

## 2016-06-02 NOTE — Clinical Social Work Note (Signed)
CSW met with patient and discussed substance abuse.  Patient expressed that she feels like she can go to substance abuse treatment on her own but does not have any insurance.  CSW provided patient with resources of substance abuse treatment options and also [address] hotline resource information.  CSW explained to patient that some substance abuse treatment centers can do a sliding scale if she does not have insurance, and that she would have to call different facilities and talk to them about cost.  Patient stated she does not usually use iv drugs, and she states she does not think she will do it again because now she is in the hospital.  Patient was appreciative of resources given to her and she states she will start contacting places.  CSW to sign off, please reconsult if other social work needs arise.  Formal assessment to follow.  Jones Broom. Norval Morton, MSW, [place]  06/02/2016 7:32 AM#

## 2016-06-02 NOTE — Clinical Social Work Note (Signed)
Clinical Social Work Assessment  Patient Details  Name: Caitlin Schneider MRN: [id number] Date of Birth: July 20, 1980  Date of referral:  06/01/16               Reason for consult:  Substance Use/ETOH Abuse                Permission sought to share information with:  Family Supports Permission granted to share information::  Yes, Verbal Permission Granted  Name::     Linskey,Evelyn Mother [phone] or Suan Halteraughton,Daryl Friend   [phone]   Agency::     Relationship::     Contact Information:     Housing/Transportation Living arrangements for the past 2 months:  Single Family Home Source of Information:  Patient Patient Interpreter Needed:  None Criminal Activity/Legal Involvement Pertinent to Current Situation/Hospitalization:  No - Comment as needed Significant Relationships:  Parents, Friend Lives with:  Parents Do you feel safe going back to the place where you live?  Yes Need for family participation in patient care:  No (Coment)  Care giving concerns: Patient was interested in information about substance abuse treatment centers.   Social Worker assessment / plan:  Patient is a 36 year old female who lives with her mother and has history of substance abuse.  Patient was admitted due to abcess from iv drug abuse.  Patient expressed she does not usually use iv drugs, and this was the first time she tried it, and now she thinks the last time because of having to come into the hospital now.  Patient does not currently have insurance, patient was requesting information about substance abuse treatment centers.  CSW explained role to patient and provided requested information to her.  CSW also informed patient that some of the facilities will take patients who do not have insurance and she will have to follow up with them and discuss options if she wants to pursue treatment.  Patient was also given information about [address] hotline and resources in [place].  Patient was  appreciative of information given to her.   Employment status:  Unemployed Health and safety inspectornsurance information:  Self Pay (Medicaid Pending) PT Recommendations:  Not assessed at this time Information / Referral to community resources:  Outpatient Substance Abuse Treatment Options, Residential Substance Abuse Treatment Options  Patient/Family's Response to care:  Patient appreciative of resources provided.  Patient/Family's Understanding of and Emotional Response to Diagnosis, Current Treatment, and Prognosis:  Patient expressed that she hopes this will be the last time she uses iv drugs and will think about pursuing treatment.  Emotional Assessment Appearance:  Appears older than stated age Attitude/Demeanor/Rapport:    Affect (typically observed):  Appropriate, Calm Orientation:  Oriented to Self, Oriented to Place, Oriented to  Time, Oriented to Situation Alcohol / Substance use:  Illicit Drugs Psych involvement (Current and /or in the community):  No (Comment)  Discharge Needs  Concerns to be addressed:  Substance Abuse Concerns Readmission within the last 30 days:  No Current discharge risk:  Substance Abuse Barriers to Discharge:  Continued Medical Work up   [place] [employer], Colbie Danner R, LCSWA 06/02/2016, 7:38 AM

## 2016-06-02 NOTE — Progress Notes (Signed)
Patient discharged home per MD order. Prescription given to patient. All discharge instructions given and all questions answered. Patient verbalized understanding of dressing changes and all discharge instructions.

## 2016-06-02 NOTE — Care Management (Signed)
Discharge to home today per Dr. Cherlynn KaiserSainani. Prescription Amoxicillin Clavulanate 875-125 mg tablet 2 x day. 28 tablets total faxed to medication management Will need to take hard copy of prescription to Medication Management. Gwenette GreetBrenda S Tyquan Carmickle RN MSN CCM Care management [phone]

## 2016-06-04 LAB — CULTURE, BLOOD (ROUTINE X 2)
Culture: NO GROWTH
Culture: NO GROWTH
Special Requests: ADEQUATE
Special Requests: ADEQUATE

## 2016-06-04 LAB — AEROBIC CULTURE W GRAM STAIN (SUPERFICIAL SPECIMEN)

## 2016-06-04 LAB — AEROBIC CULTURE  (SUPERFICIAL SPECIMEN)

## 2016-06-05 LAB — ANAEROBIC CULTURE

## 2016-06-16 ENCOUNTER — Encounter: Payer: Self-pay | Admitting: General Surgery

## 2016-06-16 ENCOUNTER — Ambulatory Visit (INDEPENDENT_AMBULATORY_CARE_PROVIDER_SITE_OTHER): Payer: Self-pay | Admitting: General Surgery

## 2016-06-16 VITALS — BP 111/79 | HR 118 | Temp 98.4°F | Ht 62.0 in | Wt 174.2 lb

## 2016-06-16 DIAGNOSIS — Z4889 Encounter for other specified surgical aftercare: Secondary | ICD-10-CM

## 2016-06-16 NOTE — Patient Instructions (Addendum)
We will see you back in 1 week. Keep a dressing over this area. The dressing needs to be washed with soap and water once daily while showering and then dried thoroughly. After it is completely dry, replace with a dry dressing. Replace dressing once daily and when visibly soiled.

## 2016-06-16 NOTE — Progress Notes (Signed)
Outpatient Surgical Follow Up  06/16/2016  Tina GriffithsKelly Nuttall is an 36 y.o. female.   Chief Complaint  Patient presents with  . Routine Post Op    I&D of Left Arm (05/31/16)- Dr. Tonita CongWoodham    HPI: 36 year old female returns to clinic for follow-up status post incision and drainage of a left forearm abscess. This was secondary to IV drug abuse. She is still on antibiotics. She is tolerating diet and having normal bowel function. She denies fevers, chills, nausea, vomiting, chest pain, shortness breath. Her only complaint is of pain to the prior abscess area.  Past Medical History:  Diagnosis Date  . Asthma   . IV drug user   . Substance abuse     Past Surgical History:  Procedure Laterality Date  . CESAREAN SECTION    . INCISION AND DRAINAGE ABSCESS Left 05/31/2016   Procedure: INCISION AND DRAINAGE ABSCESS LEFT ARM;  Surgeon: Ricarda FrameWoodham, Deajah Erkkila, MD;  Location: [facility] ORS;  Service: General;  Laterality: Left;    Family History  Problem Relation Age of Onset  . COPD Mother   . Diabetes Father   . Kidney disease Father   . Gout Brother   . Pancreatic cancer Maternal Uncle   . Diabetes Paternal Grandmother   . Lung cancer Maternal Uncle     Social History:  reports that she has been smoking Cigarettes.  She has been smoking about 0.50 packs per day. She has never used smokeless tobacco. She reports that she uses drugs, including IV, Cocaine, and Methamphetamines. She reports that she does not drink alcohol.  Allergies: No Known Allergies  Medications reviewed.    ROS A multipoint review of systems was completed, all pertinent positives and negatives are documented in the history of present illness the remainder are negative   BP 111/79   Pulse (!) 118   Temp 98.4 F (36.9 C) (Oral)   Ht 5\' 2"  (1.575 m)   Wt 79 kg (174 lb 3.2 oz)   BMI 31.86 kg/m   Physical Exam Gen.: No acute distress Chest: Clear to sedation Heart: Tachycardic Abdomen: Soft nontender Extremities: Left  upper summary abscess area healing well. Healthy-appearing granulation tissue. No evidence of infection or purulence.    No results found for this or any previous visit (from the past 48 hour(s)). No results found.  Assessment/Plan:  1. Aftercare following surgery 36 year old female status post I&D of left forearm abscess. Doing well. Discussed daily wound care and cleanliness. She is to continue her antibodies still there completed. Follow-up in clinic in 1 week for an additional wound check.     Ricarda Frameharles Asna Muldrow, MD FACS General Surgeon  06/16/2016,9:10 AM

## 2016-06-21 ENCOUNTER — Ambulatory Visit (INDEPENDENT_AMBULATORY_CARE_PROVIDER_SITE_OTHER): Payer: Self-pay | Admitting: General Surgery

## 2016-06-21 ENCOUNTER — Encounter: Payer: Self-pay | Admitting: General Surgery

## 2016-06-21 VITALS — BP 121/79 | HR 87 | Temp 98.2°F | Ht 62.0 in | Wt 170.6 lb

## 2016-06-21 DIAGNOSIS — Z4889 Encounter for other specified surgical aftercare: Secondary | ICD-10-CM

## 2016-06-21 NOTE — Patient Instructions (Signed)
Please continue to keep a dressing over the area until it heals completely.  Please do not submerge in any pool, hot tub, lake until the area has healed completely.  Please call our office if you have any questions or concerns.

## 2016-06-21 NOTE — Progress Notes (Signed)
Outpatient Surgical Follow Up  06/21/2016  Caitlin Schneider is an 36 y.o. female.   Chief Complaint  Patient presents with  . Routine Post Op    Left I&D Antecubital Abscess-05-31-16-Dr.Zohar Maroney    HPI: 36 year old female returns to clinic for additional follow-up from a left arm abscess drainage. Patient reports the area continues to get better and better. Minimal drainage and no signs of infection. She is eating well and having normal bowel function. She denies any fevers, chills, nausea, vomiting, chest pain, shortness of breath.  Past Medical History:  Diagnosis Date  . Asthma   . IV drug user   . Substance abuse     Past Surgical History:  Procedure Laterality Date  . CESAREAN SECTION    . INCISION AND DRAINAGE ABSCESS Left 05/31/2016   Procedure: INCISION AND DRAINAGE ABSCESS LEFT ARM;  Surgeon: Ricarda FrameWoodham, Egon Dittus, MD;  Location: [facility] ORS;  Service: General;  Laterality: Left;    Family History  Problem Relation Age of Onset  . COPD Mother   . Diabetes Father   . Kidney disease Father   . Gout Brother   . Pancreatic cancer Maternal Uncle   . Diabetes Paternal Grandmother   . Lung cancer Maternal Uncle     Social History:  reports that she has been smoking Cigarettes.  She has been smoking about 0.50 packs per day. She has never used smokeless tobacco. She reports that she uses drugs, including IV, Cocaine, and Methamphetamines. She reports that she does not drink alcohol.  Allergies: No Known Allergies  Medications reviewed.    ROS A multipoint review of systems was completed, all pertinent positives and negatives are documented within the history of present illness there are negative   BP 121/79   Pulse 87   Temp 98.2 F (36.8 C) (Oral)   Ht 5\' 2"  (1.575 m)   Wt 77.4 kg (170 lb 9.6 oz)   BMI 31.20 kg/m   Physical Exam Gen.: No acute distress Chest: Clear to auscultation Heart: Regular rhythm Abdomen: Soft and nontender Extremities: Left upper extremity  wound with healthy-appearing bed. No evidence of erythema, drainage, infection. Approximately 2 cm diameter wound from previous I&D    No results found for this or any previous visit (from the past 48 hour(s)). No results found.  Assessment/Plan:  1. Aftercare following surgery 36 year old female status post I&D of left upper extremity abscess secondary to IV drug abuse. Wound is healing very well. Discussed continuing current wound care routine. She understands to follow-up in clinic on an as-needed basis for any signs of worsening infection.     Ricarda Frameharles Georgina Krist, MD FACS General Surgeon  06/21/2016,3:14 PM

## 2016-10-22 ENCOUNTER — Encounter: Payer: Self-pay | Admitting: Emergency Medicine

## 2016-10-22 ENCOUNTER — Other Ambulatory Visit: Payer: Self-pay

## 2016-10-22 ENCOUNTER — Emergency Department: Payer: Medicaid Other

## 2016-10-22 DIAGNOSIS — M542 Cervicalgia: Secondary | ICD-10-CM | POA: Diagnosis present

## 2016-10-22 DIAGNOSIS — Z5321 Procedure and treatment not carried out due to patient leaving prior to being seen by health care provider: Secondary | ICD-10-CM | POA: Insufficient documentation

## 2016-10-22 DIAGNOSIS — M25511 Pain in right shoulder: Secondary | ICD-10-CM | POA: Diagnosis not present

## 2016-10-22 LAB — CBC WITH DIFFERENTIAL/PLATELET
Basophils Absolute: 0.1 10*3/uL (ref 0–0.1)
Basophils Relative: 1 %
EOS ABS: 0.3 10*3/uL (ref 0–0.7)
Eosinophils Relative: 3 %
HCT: 47.8 % — ABNORMAL HIGH (ref 35.0–47.0)
Hemoglobin: 16.7 g/dL — ABNORMAL HIGH (ref 12.0–16.0)
LYMPHS PCT: 32 %
Lymphs Abs: 2.9 10*3/uL (ref 1.0–3.6)
MCH: 30.7 pg (ref 26.0–34.0)
MCHC: 34.9 g/dL (ref 32.0–36.0)
MCV: 87.8 fL (ref 80.0–100.0)
MONOS PCT: 5 %
Monocytes Absolute: 0.4 10*3/uL (ref 0.2–0.9)
NEUTROS PCT: 59 %
Neutro Abs: 5.3 10*3/uL (ref 1.4–6.5)
Platelets: 264 10*3/uL (ref 150–440)
RBC: 5.44 MIL/uL — ABNORMAL HIGH (ref 3.80–5.20)
RDW: 14.1 % (ref 11.5–14.5)
WBC: 8.9 10*3/uL (ref 3.6–11.0)

## 2016-10-22 LAB — TROPONIN I: Troponin I: <0.03 ng/mL (ref <0.03)

## 2016-10-22 LAB — URINE DRUG SCREEN, QUALITATIVE (ARMC ONLY)
Amphetamines, Ur Screen: POSITIVE — AB
BARBITURATES, UR SCREEN: NOT DETECTED
Benzodiazepine, Ur Scrn: NOT DETECTED
CANNABINOID 50 NG, UR ~~LOC~~: POSITIVE — AB
COCAINE METABOLITE, UR ~~LOC~~: NOT DETECTED
MDMA (ECSTASY) UR SCREEN: NOT DETECTED
Methadone Scn, Ur: NOT DETECTED
OPIATE, UR SCREEN: NOT DETECTED
PHENCYCLIDINE (PCP) UR S: NOT DETECTED
TRICYCLIC, UR SCREEN: NOT DETECTED

## 2016-10-22 LAB — BASIC METABOLIC PANEL
Anion gap: 8 (ref 5–15)
BUN: 11 mg/dL (ref 6–20)
CO2: 25 mmol/L (ref 22–32)
CREATININE: 1.05 mg/dL — AB (ref 0.44–1.00)
Calcium: 8.9 mg/dL (ref 8.9–10.3)
Chloride: 107 mmol/L (ref 101–111)
GFR calc Af Amer: >60 mL/min (ref >60)
GFR calc non Af Amer: >60 mL/min (ref >60)
GLUCOSE: 91 mg/dL (ref 65–99)
Potassium: 4.5 mmol/L (ref 3.5–5.1)
Sodium: 140 mmol/L (ref 135–145)

## 2016-10-22 LAB — ETHANOL: Alcohol, Ethyl (B): <10 mg/dL (ref <10)

## 2016-10-22 LAB — HCG, QUANTITATIVE, PREGNANCY: hCG, Beta Chain, Quant, S: 1 m[IU]/mL (ref <5)

## 2016-10-22 MED ORDER — ONDANSETRON 4 MG PO TBDP
4.0000 mg | ORAL_TABLET | Freq: Once | ORAL | Status: AC
  Administered 2016-10-22: 4 mg via ORAL
  Filled 2016-10-22: qty 1

## 2016-10-22 MED ORDER — HYDROCODONE-ACETAMINOPHEN 5-325 MG PO TABS
1.0000 | ORAL_TABLET | Freq: Once | ORAL | Status: AC
  Administered 2016-10-22: 1 via ORAL
  Filled 2016-10-22: qty 1

## 2016-10-22 MED ORDER — HYDROCODONE-ACETAMINOPHEN 5-325 MG PO TABS: 2.0000 | ORAL_TABLET | Freq: Once | ORAL | Status: DC

## 2016-10-22 NOTE — ED Notes (Signed)
Pt lying in chair in lobby, no longer crying in pain.

## 2016-10-22 NOTE — ED Triage Notes (Signed)
Pt arrives to ED with c/o neck pain x 4 weeks ago when she fell and injured her right shoulder. Pt is crying and rocking in triage at this time.

## 2016-10-23 ENCOUNTER — Emergency Department
Admission: EM | Admit: 2016-10-23 | Discharge: 2016-10-23 | Disposition: A | Discharge status: Home / Self Care | Payer: Medicaid Other | Attending: Emergency Medicine | Admitting: Emergency Medicine

## 2016-10-23 NOTE — ED Notes (Signed)
Pt resting in lobby. Warm blankets previously provided to pt's significant other for comfort.

## 2016-10-23 NOTE — ED Notes (Signed)
Per officer pride pt has left. Pt no longer in waiting room.

## 2017-04-28 ENCOUNTER — Emergency Department
Admission: EM | Admit: 2017-04-28 | Discharge: 2017-04-28 | Disposition: A | Discharge status: Home / Self Care | Payer: Medicaid Other | Attending: Emergency Medicine | Admitting: Emergency Medicine

## 2017-04-28 ENCOUNTER — Other Ambulatory Visit: Payer: Self-pay

## 2017-04-28 DIAGNOSIS — T401X1A Poisoning by heroin, accidental (unintentional), initial encounter: Secondary | ICD-10-CM

## 2017-04-28 DIAGNOSIS — F1410 Cocaine abuse, uncomplicated: Secondary | ICD-10-CM | POA: Diagnosis not present

## 2017-04-28 DIAGNOSIS — F1920 Other psychoactive substance dependence, uncomplicated: Secondary | ICD-10-CM | POA: Diagnosis not present

## 2017-04-28 DIAGNOSIS — J45909 Unspecified asthma, uncomplicated: Secondary | ICD-10-CM | POA: Insufficient documentation

## 2017-04-28 DIAGNOSIS — F1510 Other stimulant abuse, uncomplicated: Secondary | ICD-10-CM | POA: Insufficient documentation

## 2017-04-28 DIAGNOSIS — T50901A Poisoning by unspecified drugs, medicaments and biological substances, accidental (unintentional), initial encounter: Secondary | ICD-10-CM | POA: Diagnosis present

## 2017-04-28 DIAGNOSIS — F17210 Nicotine dependence, cigarettes, uncomplicated: Secondary | ICD-10-CM | POA: Insufficient documentation

## 2017-04-28 MED ORDER — NICOTINE 21 MG/24HR TD PT24
21.0000 mg | MEDICATED_PATCH | Freq: Once | TRANSDERMAL | Status: DC
  Administered 2017-04-28: 21 mg via TRANSDERMAL

## 2017-04-28 MED ORDER — NICOTINE 21 MG/24HR TD PT24
MEDICATED_PATCH | TRANSDERMAL | Status: AC
  Administered 2017-04-28: 21 mg via TRANSDERMAL
  Filled 2017-04-28: qty 1

## 2017-04-28 NOTE — ED Notes (Signed)
Pt ambulatory to waiting area with mother. A&Ox4. Pt reports that she is going to take an [place] home. D/C home per MD orders.

## 2017-04-28 NOTE — ED Provider Notes (Addendum)
[facility] Emergency Department Provider Note    First MD Initiated Contact with Patient 04/28/17 [phone]     (approximate)  I have reviewed the triage vital signs and the nursing notes.   HISTORY  Chief Complaint Drug Overdose    HPI Caitlin Schneider is a 37 y.o. female with below list of chronic medical conditions presents to the emergency department via EMS following heroin overdose.  Per EMS patient was unresponsive with agonal respirations on arrival and as such 4 mg of intranasal Narcan was administered with some improvement and as such an additional 2 mg of IV Narcan was also administered with patient subsequently becoming alert and oriented with normal respirations.  Patient does admit to heroin and methamphetamine use tonight  Past Medical History:  Diagnosis Date  . Asthma   . IV drug user   . Substance abuse     Patient Active Problem List   Diagnosis Date Noted  . Left arm cellulitis   . Abscess   . Sepsis (HCC) 05/30/2016  . Cellulitis 05/30/2016  . IV drug user 05/30/2016    Past Surgical History:  Procedure Laterality Date  . CESAREAN SECTION    . INCISION AND DRAINAGE ABSCESS Left 05/31/2016   Procedure: INCISION AND DRAINAGE ABSCESS LEFT ARM;  Surgeon: Ricarda Frame, MD;  Location: [facility] ORS;  Service: General;  Laterality: Left;    Prior to Admission medications   Not on File    Allergies No known drug allergies  Family History  Problem Relation Age of Onset  . COPD Mother   . Diabetes Father   . Kidney disease Father   . Gout Brother   . Pancreatic cancer Maternal Uncle   . Diabetes Paternal Grandmother   . Lung cancer Maternal Uncle     Social History Social History   Tobacco Use  . Smoking status: Current Every Day Smoker    Packs/day: 0.50    Types: Cigarettes  . Smokeless tobacco: Never Used  Substance Use Topics  . Alcohol use: No  . Drug use: Yes    Types: IV, Cocaine, Methamphetamines    Comment:  Last Use Meth- 05/22/16 and Last Use Cocaine- 05/05/16 (Per Patient)    Review of Systems Constitutional: No fever/chills Eyes: No visual changes. ENT: No sore throat. Cardiovascular: Denies chest pain. Respiratory: Denies shortness of breath. Gastrointestinal: No abdominal pain.  No nausea, no vomiting.  No diarrhea.  No constipation. Genitourinary: Negative for dysuria. Musculoskeletal: Negative for neck pain.  Negative for back pain. Integumentary: Negative for rash. Neurological: Negative for headaches, focal weakness or numbness. Psychiatric: Positive for heroin overdose  ____________________________________________   PHYSICAL EXAM:  VITAL SIGNS: ED Triage Vitals  Enc Vitals Group     BP 04/28/17 0549 (!) 115/56     Pulse Rate 04/28/17 0549 88     Resp 04/28/17 0549 19     Temp 04/28/17 0549 98.4 F (36.9 C)     Temp Source 04/28/17 0549 Oral     SpO2 04/28/17 0600 94 %     Weight 04/28/17 0549 77.1 kg (170 lb)     Height 04/28/17 0549 1.575 m (5\' 2" )     Head Circumference --      Peak Flow --      Pain Score 04/28/17 0549 9     Pain Loc --      Pain Edu? --      Excl. in GC? --     Constitutional: Alert and  oriented.  Tearful  eyes: Conjunctivae are normal. Head: Atraumatic. Mouth/Throat: Mucous membranes are moist. Oropharynx non-erythematous. Neck: No stridor.   Cardiovascular: Normal rate, regular rhythm. Good peripheral circulation. Grossly normal heart sounds. Respiratory: Normal respiratory effort.  No retractions. Lungs CTAB. Gastrointestinal: Soft and nontender. No distention.  Musculoskeletal: No lower extremity tenderness nor edema. No gross deformities of extremities. Neurologic:  Normal speech and language. No gross focal neurologic deficits are appreciated.  Skin:  Skin is warm, dry and intact. No rash noted. Psychiatric: Tearful speech and behavior are normal.   ED ECG REPORT I, [place] N Kewan Mcnease, the attending physician, personally viewed and  interpreted this ECG.   Date: 04/28/2017  EKG Time: 5:48 AM  Rate: 87  Rhythm: Normal sinus rhythm  Axis: Normal  Intervals: Normal  ST&T Change: None   Procedures   ____________________________________________   INITIAL IMPRESSION / ASSESSMENT AND PLAN / ED COURSE  As part of my medical decision making, I reviewed the following data within the electronic MEDICAL RECORD NUMBER 37 year old female presented with above-stated history and physical exam consistent with heroin overdose.  Spoke with the patient at length and offered opiate detox to which the patient refused.  Patient will be observed in the emergency department for 2 hours and if vital signs remained stable will be discharged home. ____________________________________________  FINAL CLINICAL IMPRESSION(S) / ED DIAGNOSES  Final diagnoses:  Accidental overdose of heroin, initial encounter (HCC)     MEDICATIONS GIVEN DURING THIS VISIT:  Medications  nicotine (NICODERM CQ - dosed in mg/24 hours) patch 21 mg (21 mg Transdermal Patch Applied 04/28/17 0559)     ED Discharge Orders    None       Note:  This document was prepared using Dragon voice recognition software and may include unintentional dictation errors.    Darci CurrentBrown, [place] N, MD 04/28/17 [phone]    Darci CurrentBrown, [place] N, MD 04/28/17 [phone]

## 2017-04-28 NOTE — ED Triage Notes (Signed)
Pt arrived via EMS from home d/t accidental overdose on heroin. EMS reports agonal respirations upon arrival and administering $RemoveBefor [id number]$2mg

## 2017-08-14 ENCOUNTER — Other Ambulatory Visit: Payer: Self-pay

## 2017-08-14 ENCOUNTER — Encounter: Payer: Self-pay | Admitting: Emergency Medicine

## 2017-08-14 ENCOUNTER — Emergency Department
Admission: EM | Admit: 2017-08-14 | Discharge: 2017-08-14 | Disposition: A | Discharge status: Home / Self Care | Payer: Medicaid Other | Attending: Emergency Medicine | Admitting: Emergency Medicine

## 2017-08-14 DIAGNOSIS — F1410 Cocaine abuse, uncomplicated: Secondary | ICD-10-CM | POA: Insufficient documentation

## 2017-08-14 DIAGNOSIS — J45909 Unspecified asthma, uncomplicated: Secondary | ICD-10-CM | POA: Insufficient documentation

## 2017-08-14 DIAGNOSIS — F1510 Other stimulant abuse, uncomplicated: Secondary | ICD-10-CM | POA: Insufficient documentation

## 2017-08-14 DIAGNOSIS — F17210 Nicotine dependence, cigarettes, uncomplicated: Secondary | ICD-10-CM | POA: Insufficient documentation

## 2017-08-14 DIAGNOSIS — R200 Anesthesia of skin: Secondary | ICD-10-CM | POA: Insufficient documentation

## 2017-08-14 DIAGNOSIS — F1920 Other psychoactive substance dependence, uncomplicated: Secondary | ICD-10-CM | POA: Diagnosis not present

## 2017-08-14 DIAGNOSIS — R51 Headache: Secondary | ICD-10-CM | POA: Diagnosis not present

## 2017-08-14 DIAGNOSIS — L040 Acute lymphadenitis of face, head and neck: Secondary | ICD-10-CM | POA: Insufficient documentation

## 2017-08-14 DIAGNOSIS — R070 Pain in throat: Secondary | ICD-10-CM | POA: Diagnosis present

## 2017-08-14 LAB — COMPREHENSIVE METABOLIC PANEL
ALBUMIN: 3.6 g/dL (ref 3.5–5.0)
ALT: 22 U/L (ref 0–44)
AST: 27 U/L (ref 15–41)
Alkaline Phosphatase: 65 U/L (ref 38–126)
Anion gap: 8 (ref 5–15)
BUN: 17 mg/dL (ref 6–20)
CHLORIDE: 106 mmol/L (ref 98–111)
CO2: 24 mmol/L (ref 22–32)
CREATININE: 0.85 mg/dL (ref 0.44–1.00)
Calcium: 8.7 mg/dL — ABNORMAL LOW (ref 8.9–10.3)
GFR calc non Af Amer: >60 mL/min (ref >60)
GLUCOSE: 105 mg/dL — AB (ref 70–99)
Potassium: 4.1 mmol/L (ref 3.5–5.1)
Sodium: 138 mmol/L (ref 135–145)
Total Bilirubin: 0.6 mg/dL (ref 0.3–1.2)
Total Protein: 6.9 g/dL (ref 6.5–8.1)

## 2017-08-14 LAB — CBC WITH DIFFERENTIAL/PLATELET
BASOS ABS: 0.1 10*3/uL (ref 0–0.1)
Basophils Relative: 1 %
EOS PCT: 3 %
Eosinophils Absolute: 0.3 10*3/uL (ref 0–0.7)
HCT: 47 % (ref 35.0–47.0)
HEMOGLOBIN: 16.2 g/dL — AB (ref 12.0–16.0)
Lymphocytes Relative: 40 %
Lymphs Abs: 3.7 10*3/uL — ABNORMAL HIGH (ref 1.0–3.6)
MCH: 30.3 pg (ref 26.0–34.0)
MCHC: 34.5 g/dL (ref 32.0–36.0)
MCV: 88 fL (ref 80.0–100.0)
Monocytes Absolute: 0.5 10*3/uL (ref 0.2–0.9)
Monocytes Relative: 6 %
NEUTROS PCT: 50 %
Neutro Abs: 4.7 10*3/uL (ref 1.4–6.5)
PLATELETS: 292 10*3/uL (ref 150–440)
RBC: 5.34 MIL/uL — AB (ref 3.80–5.20)
RDW: 14.2 % (ref 11.5–14.5)
WBC: 9.3 10*3/uL (ref 3.6–11.0)

## 2017-08-14 LAB — GROUP A STREP BY PCR: GROUP A STREP BY PCR: NOT DETECTED

## 2017-08-14 MED ORDER — AMOXICILLIN-POT CLAVULANATE 875-125 MG PO TABS
1.0000 | ORAL_TABLET | Freq: Once | ORAL | Status: AC
  Administered 2017-08-14: 1 via ORAL
  Filled 2017-08-14: qty 1

## 2017-08-14 MED ORDER — OXYCODONE-ACETAMINOPHEN 5-325 MG PO TABS
1.0000 | ORAL_TABLET | Freq: Once | ORAL | Status: AC
  Administered 2017-08-14: 1 via ORAL
  Filled 2017-08-14: qty 1

## 2017-08-14 MED ORDER — AMOXICILLIN-POT CLAVULANATE 875-125 MG PO TABS: 1.0000 | ORAL_TABLET | Freq: Two times a day (BID) | ORAL | 0 refills | Status: AC

## 2017-08-14 NOTE — ED Triage Notes (Signed)
C/O sore throat, bilateral ear pain, and right neck lymph nodes enlarged.

## 2017-08-14 NOTE — ED Provider Notes (Signed)
[facility] Emergency Department Provider Note   ____________________________________________   First MD Initiated Contact with Patient 08/14/17 1938     (approximate)  I have reviewed the triage vital signs and the nursing notes.   HISTORY  Chief Complaint Sore Throat and Otalgia    HPI Caitlin Schneider is a 37 y.o. female patient complains of sore throat for 2 weeks swollen lymph gland on the right side of the neck.  This is also been going on for about 2 weeks she reports it comes and goes is very tender when it comes up but then will go away completely.  She did not complain to me about earache.  She says she has numbness in radial side of both wrists and it hurts to make a fist with her fingers this is been going on for quite some time.  She says that her toes intermittently turn purple and she has trouble getting up from the ground if she sits and if she sits too long her feet and ankles will go numb.  This is been going on for at least months she also complains of headaches which have been going on for many months.  The headaches are worse with bright lights or loud noises get worse if she tries to take a nap she has had Advil and Tylenol for them without help.  When I touch the lymph gland which is about one half of a centimeter she begins crying and says it hurts.  Patient reports she has chronic back pain for years.  Massage helps it but then it gets bad as soon as she moves.   Past Medical History:  Diagnosis Date  . Asthma   . IV drug user   . Substance abuse [facility])     Patient Active Problem List   Diagnosis Date Noted  . Left arm cellulitis   . Abscess   . Sepsis (HCC) 05/30/2016  . Cellulitis 05/30/2016  . IV drug user 05/30/2016    Past Surgical History:  Procedure Laterality Date  . CESAREAN SECTION    . INCISION AND DRAINAGE ABSCESS Left 05/31/2016   Procedure: INCISION AND DRAINAGE ABSCESS LEFT ARM;  Surgeon: Ricarda Frame, MD;   Location: [facility] ORS;  Service: General;  Laterality: Left;    Prior to Admission medications   Medication Sig Start Date End Date Taking? Authorizing Provider  amoxicillin-clavulanate (AUGMENTIN) 875-125 MG tablet Take 1 tablet by mouth 2 (two) times daily for 10 days. 08/14/17 08/24/17  Arnaldo Natal, MD    Allergies Patient has no known allergies.  Family History  Problem Relation Age of Onset  . COPD Mother   . Diabetes Father   . Kidney disease Father   . Gout Brother   . Pancreatic cancer Maternal Uncle   . Diabetes Paternal Grandmother   . Lung cancer Maternal Uncle     Social History Social History   Tobacco Use  . Smoking status: Current Every Day Smoker    Packs/day: 0.50    Types: Cigarettes  . Smokeless tobacco: Never Used  Substance Use Topics  . Alcohol use: No  . Drug use: Yes    Types: IV, Cocaine, Methamphetamines    Comment: Last Use Meth- 05/22/16 and Last Use Cocaine- 05/05/16 (Per Patient)    Review of Systems  Constitutional: No fever/chills Eyes: No visual changes. ENT:  sore throat. Cardiovascular: Denies chest pain. Respiratory: Denies shortness of breath. Gastrointestinal: No abdominal pain.  No nausea, no vomiting.  No diarrhea.  No constipation. Genitourinary: Negative for dysuria. Musculoskeletal: Chronic back pain. Skin: Negative for rash. Neurological: Negative for headaches, focal weakness  ____________________________________________   PHYSICAL EXAM:  VITAL SIGNS: ED Triage Vitals [08/14/17 1903]  Enc Vitals Group     BP 95/71     Pulse Rate 88     Resp 20     Temp 98.5 F (36.9 C)     Temp Source Oral     SpO2 100 %     Weight 180 lb (81.6 kg)     Height 5\' 3"  (1.6 m)     Head Circumference      Peak Flow      Pain Score 8     Pain Loc      Pain Edu?      Excl. in GC?     Constitutional: Alert and oriented. Well appearing and in no acute distress. Eyes: Conjunctivae are normal.  Head: Atraumatic. Nose: No  congestion/rhinnorhea.  Ears TMs are clear Mouth/Throat: Mucous membranes are moist.  Oropharynx non-erythematous. Neck: No stridor.  No cervical spine tenderness to palpation. Hematological/Lymphatic/Immunilogical: 1 tender swollen cervical node as described in HPI Cardiovascular: Normal rate, regular rhythm. Grossly normal heart sounds.  Good peripheral circulation. Respiratory: Normal respiratory effort.  No retractions. Lungs CTAB. Gastrointestinal: Soft and nontender. No distention. No abdominal bruits. No CVA tenderness. Musculoskeletal: No lower extremity tenderness nor edema.  Had present normal radial pulses bilaterally normal color and sensation in the fingers she does complain of the numbness in an area from it ranges from the wrist up about 3 to 4 cm on the radial side of the each arm.  Her pulses in the dorsalis pedis arteries of both feet are normal I took the right sock off and the right foot looks normal left sock I did not remove Neurologic:  Normal speech and language. No gross focal neurologic deficits are appreciated. No gait instability. Skin:  Skin is warm, dry and intact. No rash noted. Psychiatric: Mood and affect are normal. Speech and behavior are normal.  ____________________________________________   LABS (all labs ordered are listed, but only abnormal results are displayed)  Labs Reviewed  CBC WITH DIFFERENTIAL/PLATELET - Abnormal; Notable for the following components:      Result Value   RBC 5.34 (*)    Hemoglobin 16.2 (*)    Lymphs Abs 3.7 (*)    All other components within normal limits  COMPREHENSIVE METABOLIC PANEL - Abnormal; Notable for the following components:   Glucose, Bld 105 (*)    Calcium 8.7 (*)    All other components within normal limits  GROUP A STREP BY PCR   ____________________________________________  EKG  _____________________________________  RADIOLOGY  ED MD interpretation:    Official radiology report(s): No results  found.  ____________________________________________   PROCEDURES  Procedure(s) performed:   Procedures  Critical Care performed:   ____________________________________________   INITIAL IMPRESSION / ASSESSMENT AND PLAN / ED COURSE  Patient has her chronic headaches wrist numbness pain when she makes a fist in both of her hands without any obvious swelling in the joints problems with back pain and numbness in her legs when she gets up after sitting for a while all of which have been ongoing for many weeks at the least.  I will have her follow-up with primary care to begin to work on these problems and give her some antibiotics for her cervical adenitis.  Her lab work is essentially negative strep test was  negative as well.  I will not give her any pain medicine as she has a history of drug abuse but I will give her 1 pain pill here since she does appear to be in some discomfort.         ____________________________________________   FINAL CLINICAL IMPRESSION(S) / ED DIAGNOSES  Final diagnoses:  Acute cervical adenitis     ED Discharge Orders        Ordered    amoxicillin-clavulanate (AUGMENTIN) 875-125 MG tablet  2 times daily     08/14/17 2052       Note:  This document was prepared using Dragon voice recognition software and may include unintentional dictation errors.    Arnaldo Natal, MD 08/14/17 2126

## 2017-08-14 NOTE — ED Triage Notes (Signed)
Patient to ER for c/o sore throat x2 weeks, as well as swollen lymph nodes and bilateral ear pain. Patient also reports numbness to both legs x2 months intermittently. States her toes intermittently turn purple. When asked if legs were numb to touch or felt like parasthesias, patient states it is both. Patient states it happens to her hands as well, more so on the right that she knows has carpal tunnel.

## 2017-08-14 NOTE — Discharge Instructions (Signed)
Days take the Augmentin 1 pill twice a day with food.  Please return for worse pain fever vomiting or feeling sicker.  Also please return if you are not any better in 2 to 3 days.  Please follow-[facility] with [facility] or [facility] or [facility] or the other groups in the area to further evaluate your arm and leg symptoms as well as your headaches and back pain.  At home for the next day or 2 you can try Motrin or Tylenol for the swollen gland that is tender.  I would not use anything stronger than that for now because and we will not know if it is getting worse or not.

## 2017-10-30 ENCOUNTER — Emergency Department: Payer: Self-pay

## 2017-10-30 ENCOUNTER — Emergency Department
Admission: EM | Admit: 2017-10-30 | Discharge: 2017-10-30 | Disposition: A | Discharge status: Home / Self Care | Payer: Self-pay | Attending: Emergency Medicine | Admitting: Emergency Medicine

## 2017-10-30 ENCOUNTER — Other Ambulatory Visit: Payer: Self-pay

## 2017-10-30 DIAGNOSIS — S0012XA Contusion of left eyelid and periocular area, initial encounter: Secondary | ICD-10-CM

## 2017-10-30 DIAGNOSIS — R8271 Bacteriuria: Secondary | ICD-10-CM

## 2017-10-30 DIAGNOSIS — J45909 Unspecified asthma, uncomplicated: Secondary | ICD-10-CM | POA: Insufficient documentation

## 2017-10-30 DIAGNOSIS — Y929 Unspecified place or not applicable: Secondary | ICD-10-CM | POA: Insufficient documentation

## 2017-10-30 DIAGNOSIS — R079 Chest pain, unspecified: Secondary | ICD-10-CM

## 2017-10-30 DIAGNOSIS — F17210 Nicotine dependence, cigarettes, uncomplicated: Secondary | ICD-10-CM | POA: Insufficient documentation

## 2017-10-30 DIAGNOSIS — Y939 Activity, unspecified: Secondary | ICD-10-CM | POA: Insufficient documentation

## 2017-10-30 DIAGNOSIS — K625 Hemorrhage of anus and rectum: Secondary | ICD-10-CM

## 2017-10-30 DIAGNOSIS — Y999 Unspecified external cause status: Secondary | ICD-10-CM | POA: Insufficient documentation

## 2017-10-30 LAB — POCT PREGNANCY, URINE
PREG TEST UR: NEGATIVE
PREG TEST UR: NEGATIVE

## 2017-10-30 LAB — URINALYSIS, COMPLETE (UACMP) WITH MICROSCOPIC
Bilirubin Urine: NEGATIVE
Glucose, UA: NEGATIVE mg/dL
Hgb urine dipstick: NEGATIVE
KETONES UR: NEGATIVE mg/dL
LEUKOCYTES UA: NEGATIVE
Nitrite: NEGATIVE
PROTEIN: NEGATIVE mg/dL
Specific Gravity, Urine: 1.009 (ref 1.005–1.030)
pH: 5 (ref 5.0–8.0)

## 2017-10-30 LAB — CBC
HEMATOCRIT: 45 % (ref 36.0–46.0)
Hemoglobin: 15.5 g/dL — ABNORMAL HIGH (ref 12.0–15.0)
MCH: 29.6 pg (ref 26.0–34.0)
MCHC: 34.4 g/dL (ref 30.0–36.0)
MCV: 86 fL (ref 80.0–100.0)
Platelets: 283 10*3/uL (ref 150–400)
RBC: 5.23 MIL/uL — ABNORMAL HIGH (ref 3.87–5.11)
RDW: 13.4 % (ref 11.5–15.5)
WBC: 9.7 10*3/uL (ref 4.0–10.5)
nRBC: 0 % (ref 0.0–0.2)

## 2017-10-30 LAB — BASIC METABOLIC PANEL
Anion gap: 8 (ref 5–15)
BUN: 9 mg/dL (ref 6–20)
CALCIUM: 8.7 mg/dL — AB (ref 8.9–10.3)
CO2: 23 mmol/L (ref 22–32)
CREATININE: 0.87 mg/dL (ref 0.44–1.00)
Chloride: 109 mmol/L (ref 98–111)
GFR calc non Af Amer: >60 mL/min (ref >60)
Glucose, Bld: 97 mg/dL (ref 70–99)
Potassium: 4.2 mmol/L (ref 3.5–5.1)
SODIUM: 140 mmol/L (ref 135–145)

## 2017-10-30 MED ORDER — CEPHALEXIN 500 MG PO CAPS
500.0000 mg | ORAL_CAPSULE | Freq: Once | ORAL | Status: AC
  Administered 2017-10-30: 500 mg via ORAL
  Filled 2017-10-30: qty 1

## 2017-10-30 MED ORDER — CEPHALEXIN 500 MG PO CAPS: 500.0000 mg | ORAL_CAPSULE | Freq: Four times a day (QID) | ORAL | 0 refills | Status: AC

## 2017-10-30 MED ORDER — ACETAMINOPHEN 500 MG PO TABS
1000.0000 mg | ORAL_TABLET | Freq: Once | ORAL | Status: AC
  Administered 2017-10-30: 1000 mg via ORAL
  Filled 2017-10-30: qty 2
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
## 2017-10-30 NOTE — ED Notes (Signed)
Notified lab about the urine culture being added on.
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
## 2017-11-01 LAB — URINE CULTURE

## 2017-12-26 ENCOUNTER — Encounter: Payer: Self-pay | Admitting: Emergency Medicine

## 2017-12-26 ENCOUNTER — Emergency Department
Admission: EM | Admit: 2017-12-26 | Discharge: 2017-12-26 | Disposition: A | Discharge status: Home / Self Care | Payer: Self-pay | Attending: Student in an Organized Health Care Education/Training Program | Admitting: Student in an Organized Health Care Education/Training Program

## 2017-12-26 ENCOUNTER — Emergency Department: Payer: Self-pay

## 2017-12-26 ENCOUNTER — Other Ambulatory Visit: Payer: Self-pay

## 2017-12-26 DIAGNOSIS — O99511 Diseases of the respiratory system complicating pregnancy, first trimester: Secondary | ICD-10-CM | POA: Insufficient documentation

## 2017-12-26 DIAGNOSIS — O99331 Smoking (tobacco) complicating pregnancy, first trimester: Secondary | ICD-10-CM | POA: Insufficient documentation

## 2017-12-26 DIAGNOSIS — F17210 Nicotine dependence, cigarettes, uncomplicated: Secondary | ICD-10-CM | POA: Insufficient documentation

## 2017-12-26 DIAGNOSIS — N939 Abnormal uterine and vaginal bleeding, unspecified: Secondary | ICD-10-CM

## 2017-12-26 DIAGNOSIS — O200 Threatened abortion: Secondary | ICD-10-CM | POA: Insufficient documentation

## 2017-12-26 DIAGNOSIS — Z3201 Encounter for pregnancy test, result positive: Secondary | ICD-10-CM | POA: Insufficient documentation

## 2017-12-26 DIAGNOSIS — J45909 Unspecified asthma, uncomplicated: Secondary | ICD-10-CM | POA: Insufficient documentation

## 2017-12-26 LAB — HCG, QUANTITATIVE, PREGNANCY: hCG, Beta Chain, Quant, S: 5997 m[IU]/mL — ABNORMAL HIGH (ref <5)

## 2017-12-26 LAB — POCT PREGNANCY, URINE: Preg Test, Ur: POSITIVE — AB

## 2017-12-26 NOTE — ED Notes (Signed)
Sent purple and green top. Gave PT urine cup.

## 2017-12-26 NOTE — ED Notes (Signed)
FIRST NURSE NOTE: Pt reports she thinks she is having a miscarriage, states her last period was in October and just recently found out she was pregnant.  Pt is tearful in lobby. Placed in wheelchair on arrival.

## 2017-12-26 NOTE — ED Provider Notes (Signed)
[facility] Emergency Department Provider Note    None    (approximate)  I have reviewed the triage vital signs and the nursing notes.   HISTORY  Chief Complaint Vaginal Bleeding    HPI Caitlin Schneider is a 37 y.o. female the below listed past medical history reports that she just found out on Friday that she is pregnant with taking her home pregnancy test.  Last menstrual period was at the end of October but was abnormal.  This is her sixth pregnancy.  Has had 2 previous miscarriages and 2 elective abortions.  States that this morning she woke up with crampy abdominal pain and vaginal bleeding.  Denies any pain at this time.  States the pain was right-sided.  Denies any fevers.  Denies any discharge.  No dysuria.    Past Medical History:  Diagnosis Date  . Asthma   . IV drug user   . Substance abuse (HCC)    Family History  Problem Relation Age of Onset  . COPD Mother   . Diabetes Father   . Kidney disease Father   . Gout Brother   . Pancreatic cancer Maternal Uncle   . Diabetes Paternal Grandmother   . Lung cancer Maternal Uncle    Past Surgical History:  Procedure Laterality Date  . CESAREAN SECTION    . INCISION AND DRAINAGE ABSCESS Left 05/31/2016   Procedure: INCISION AND DRAINAGE ABSCESS LEFT ARM;  Surgeon: Ricarda Frame, MD;  Location: [facility] ORS;  Service: General;  Laterality: Left;   Patient Active Problem List   Diagnosis Date Noted  . Left arm cellulitis   . Abscess   . Sepsis (HCC) 05/30/2016  . Cellulitis 05/30/2016  . IV drug user 05/30/2016      Prior to Admission medications   Not on File    Allergies Patient has no known allergies.    Social History Social History   Tobacco Use  . Smoking status: Current Every Day Smoker    Packs/day: 0.50    Types: Cigarettes  . Smokeless tobacco: Never Used  Substance Use Topics  . Alcohol use: No  . Drug use: Yes    Types: IV, Cocaine, Methamphetamines    Comment:  Last Use Meth- 05/22/16 and Last Use Cocaine- 05/05/16 (Per Patient)    Review of Systems Patient denies headaches, rhinorrhea, blurry vision, numbness, shortness of breath, chest pain, edema, cough, abdominal pain, nausea, vomiting, diarrhea, dysuria, fevers, rashes or hallucinations unless otherwise stated above in HPI. ____________________________________________   PHYSICAL EXAM:  VITAL SIGNS: Vitals:   12/26/17 1102  BP: (!) 103/56  Pulse: 86  Resp: (!) 22  Temp: 98 F (36.7 C)  SpO2: 97%    Constitutional: Alert and oriented.  Eyes: Conjunctivae are normal.  Head: Atraumatic. Nose: No congestion/rhinnorhea. Mouth/Throat: Mucous membranes are moist.   Neck: No stridor. Painless ROM.  Cardiovascular: Normal rate, regular rhythm. Grossly normal heart sounds.  Good peripheral circulation. Respiratory: Normal respiratory effort.  No retractions. Lungs CTAB. Gastrointestinal: Soft and nontender. No distention. No abdominal bruits. No CVA tenderness. Genitourinary: deferred Musculoskeletal: No lower extremity tenderness nor edema.  No joint effusions. Neurologic:  Normal speech and language. No gross focal neurologic deficits are appreciated. No facial droop Skin:  Skin is warm, dry and intact. No rash noted. Psychiatric: Mood and affect are normal. Speech and behavior are normal.  ____________________________________________   LABS (all labs ordered are listed, but only abnormal results are displayed)  Results for orders placed  or performed during the hospital encounter of 12/26/17 (from the past 24 hour(s))  hCG, quantitative, pregnancy     Status: Abnormal   Collection Time: 12/26/17 11:03 AM  Result Value Ref Range   hCG, Beta Chain, Quant, S 5,997 (H) <5 mIU/mL  Pregnancy, urine POC     Status: Abnormal   Collection Time: 12/26/17 11:15 AM  Result Value Ref Range   Preg Test, Ur POSITIVE (A) NEGATIVE  ABO/Rh     Status: None   Collection Time: 12/26/17 12:38 PM    Result Value Ref Range   ABO/RH(D)      O POS Performed at [facility], [address] [place], [place] [address]    ____________________________________________________________________________________  RADIOLOGY  I personally reviewed all radiographic images ordered to evaluate for the above acute complaints and reviewed radiology reports and findings.  These findings were personally discussed with the patient.  Please see medical record for radiology report.  ____________________________________________   PROCEDURES  Procedure(s) performed:  Procedures    Critical Care performed: no ____________________________________________   INITIAL IMPRESSION / ASSESSMENT AND PLAN / ED COURSE  Pertinent labs & imaging results that were available during my care of the patient were reviewed by me and considered in my medical decision making (see chart for details).   DDX: ectopic, threatened ab, aub, abruption, subchorionic hematoma  Caitlin Schneider is a 37 y.o. who presents to the ED with vaginal bleeding in early pregnancy as well as cramping pain.  Ultrasound as well as blood work will be sent for the above differential.  She is nontoxic-appearing and well perfused.  Abdominal exam soft and benign.  Clinical Course as of Dec 27 1335  Wed Dec 26, 2017  1335 Discussed results of ultrasound with patient.  She is Rh+.  At this point she is stable and appropriate for outpatient follow-up.  Discussed signs and symptoms for which she should return immediately to the hospital.   [PR]    Clinical Course User Index [PR] Willy Eddyobinson, Avani Sensabaugh, MD     As part of my medical decision making, I reviewed the following data within the electronic MEDICAL RECORD NUMBER Nursing notes reviewed and incorporated, Labs reviewed, notes from prior ED visits and [place] Controlled Substance Database   ____________________________________________   FINAL CLINICAL IMPRESSION(S) / ED DIAGNOSES  Final  diagnoses:  Vaginal bleeding  Threatened miscarriage      NEW MEDICATIONS STARTED DURING THIS VISIT:  New Prescriptions   No medications on file     Note:  This document was prepared using Dragon voice recognition software and may include unintentional dictation errors.    Willy Eddyobinson, Jemmie Ledgerwood, MD 12/26/17 [phone]

## 2017-12-26 NOTE — ED Triage Notes (Signed)
Pt reports that she just found out on Friday that she is pregnant and woke this am with bleeding and cramping.

## 2017-12-29 ENCOUNTER — Encounter: Payer: Self-pay | Admitting: Emergency Medicine

## 2017-12-29 ENCOUNTER — Emergency Department
Admission: EM | Admit: 2017-12-29 | Discharge: 2017-12-29 | Disposition: A | Discharge status: Home / Self Care | Payer: Medicaid Other | Attending: Emergency Medicine | Admitting: Emergency Medicine

## 2017-12-29 DIAGNOSIS — O99334 Smoking (tobacco) complicating childbirth: Secondary | ICD-10-CM | POA: Insufficient documentation

## 2017-12-29 DIAGNOSIS — Z3A01 Less than 8 weeks gestation of pregnancy: Secondary | ICD-10-CM | POA: Insufficient documentation

## 2017-12-29 DIAGNOSIS — O200 Threatened abortion: Secondary | ICD-10-CM | POA: Insufficient documentation

## 2017-12-29 DIAGNOSIS — J45909 Unspecified asthma, uncomplicated: Secondary | ICD-10-CM | POA: Insufficient documentation

## 2017-12-29 DIAGNOSIS — F17210 Nicotine dependence, cigarettes, uncomplicated: Secondary | ICD-10-CM | POA: Insufficient documentation

## 2017-12-29 DIAGNOSIS — O9989 Other specified diseases and conditions complicating pregnancy, childbirth and the puerperium: Secondary | ICD-10-CM | POA: Insufficient documentation

## 2017-12-29 DIAGNOSIS — O039 Complete or unspecified spontaneous abortion without complication: Secondary | ICD-10-CM

## 2017-12-29 LAB — CBC
HEMATOCRIT: 50.2 % — AB (ref 36.0–46.0)
Hemoglobin: 17 g/dL — ABNORMAL HIGH (ref 12.0–15.0)
MCH: 29.8 pg (ref 26.0–34.0)
MCHC: 33.9 g/dL (ref 30.0–36.0)
MCV: 87.9 fL (ref 80.0–100.0)
NRBC: 0 % (ref 0.0–0.2)
PLATELETS: 355 10*3/uL (ref 150–400)
RBC: 5.71 MIL/uL — ABNORMAL HIGH (ref 3.87–5.11)
RDW: 13.7 % (ref 11.5–15.5)
WBC: 13.3 10*3/uL — AB (ref 4.0–10.5)

## 2017-12-29 LAB — HCG, QUANTITATIVE, PREGNANCY: hCG, Beta Chain, Quant, S: 998 m[IU]/mL — ABNORMAL HIGH (ref <5)

## 2017-12-29 MED ORDER — IBUPROFEN 800 MG PO TABS: 800.0000 mg | ORAL_TABLET | Freq: Three times a day (TID) | ORAL | 0 refills | Status: DC | PRN

## 2017-12-29 MED ORDER — KETOROLAC TROMETHAMINE 30 MG/ML IJ SOLN
30.0000 mg | Freq: Once | INTRAMUSCULAR | Status: AC
  Administered 2017-12-29: 30 mg via INTRAVENOUS
  Filled 2017-12-29: qty 1

## 2017-12-29 MED ORDER — MORPHINE SULFATE (PF) 4 MG/ML IV SOLN
4.0000 mg | Freq: Once | INTRAVENOUS | Status: AC
  Administered 2017-12-29: 4 mg via INTRAVENOUS
  Filled 2017-12-29: qty 1

## 2017-12-29 MED ORDER — KETOROLAC TROMETHAMINE 30 MG/ML IJ SOLN: 30.0000 mg | Freq: Once | INTRAMUSCULAR | Status: DC

## 2017-12-29 MED ORDER — LORAZEPAM 1 MG PO TABS: 1.0000 mg | ORAL_TABLET | Freq: Two times a day (BID) | ORAL | 0 refills | Status: AC

## 2017-12-29 MED ORDER — LORAZEPAM 2 MG/ML IJ SOLN
1.0000 mg | Freq: Once | INTRAMUSCULAR | Status: AC
  Administered 2017-12-29: 1 mg via INTRAVENOUS
  Filled 2017-12-29: qty 1

## 2017-12-29 NOTE — ED Triage Notes (Signed)
Patient c/o pelvic pain and heavy vaginal bleeding during pregnancy. Patient is 5-[redacted] weeks pregnant. Patient was seen here previously for same and dx with threatened miscarriage. Now reports the blood is darker/heavier and pain is more severe.

## 2017-12-29 NOTE — ED Notes (Addendum)
Pt assisted to bathroom. Scant amount of blood noted to chux pad and when pt wiped.

## 2017-12-29 NOTE — ED Provider Notes (Signed)
[facility] Emergency Department Provider Note       Time seen: ----------------------------------------- 7:46 PM on 12/29/2017 -----------------------------------------   I have reviewed the triage vital signs and the nursing notes.  HISTORY   Chief Complaint Vaginal Bleeding    HPI Caitlin Schneider is a 37 y.o. female with a history of asthma, IV drug abuse, cellulitis who presents to the ED for pelvic pain and heavy vaginal bleeding.  Patient reports she is 5 to [redacted] weeks pregnant, was seen previously for same diagnosed with a threatened miscarriage.  Now she reports blood is darker and heavier and the pain is more severe in her lower abdomen.  Past Medical History:  Diagnosis Date  . Asthma   . IV drug user   . Substance abuse [facility])     Patient Active Problem List   Diagnosis Date Noted  . Left arm cellulitis   . Abscess   . Sepsis (HCC) 05/30/2016  . Cellulitis 05/30/2016  . IV drug user 05/30/2016    Past Surgical History:  Procedure Laterality Date  . CESAREAN SECTION    . INCISION AND DRAINAGE ABSCESS Left 05/31/2016   Procedure: INCISION AND DRAINAGE ABSCESS LEFT ARM;  Surgeon: Ricarda Frame, MD;  Location: [facility] ORS;  Service: General;  Laterality: Left;    Allergies Patient has no known allergies.  Social History Social History   Tobacco Use  . Smoking status: Current Every Day Smoker    Packs/day: 0.50    Types: Cigarettes  . Smokeless tobacco: Never Used  Substance Use Topics  . Alcohol use: No  . Drug use: Yes    Types: IV, Cocaine, Methamphetamines    Comment: Last Use Meth- 05/22/16 and Last Use Cocaine- 05/05/16 (Per Patient)   Review of Systems Constitutional: Negative for fever. Cardiovascular: Negative for chest pain. Respiratory: Negative for shortness of breath. Gastrointestinal: Positive for lower abdominal pain Genitourinary: Positive for vaginal bleeding Musculoskeletal: Negative for back pain. Skin: Negative  for rash. Neurological: Negative for headaches, focal weakness or numbness.  All systems negative/normal/unremarkable except as stated in the HPI  ____________________________________________   PHYSICAL EXAM:  VITAL SIGNS: ED Triage Vitals  Enc Vitals Group     BP 12/29/17 1927 (!) 170/64     Pulse Rate 12/29/17 1927 (!) 126     Resp 12/29/17 1927 (!) 24     Temp 12/29/17 1927 98.6 F (37 C)     Temp src --      SpO2 12/29/17 1927 97 %     Weight 12/29/17 1928 174 lb 2.6 oz (79 kg)     Height --      Head Circumference --      Peak Flow --      Pain Score 12/29/17 1928 10     Pain Loc --      Pain Edu? --      Excl. in GC? --    Constitutional: Alert and oriented.  Anxious and hyperventilating Eyes: Conjunctivae are normal. Normal extraocular movements. Cardiovascular: Normal rate, regular rhythm. No murmurs, rubs, or gallops. Respiratory: Tachypnea with clear breath sounds Gastrointestinal: Mild lower abdominal tenderness, normal bowel sounds Genitourinary: Mild bright vaginal bleeding is noted Musculoskeletal: Nontender with normal range of motion in extremities. No lower extremity tenderness nor edema. Neurologic:  Normal speech and language. No gross focal neurologic deficits are appreciated.  Skin:  Skin is warm, dry and intact. No rash noted. Psychiatric: Anxious mood and affect ____________________________________________  ED COURSE:  As  part of my medical decision making, I reviewed the following data within the electronic MEDICAL RECORD NUMBER History obtained from family if available, nursing notes, old chart and ekg, as well as notes from prior ED visits. Patient presented for likely miscarriage, we will assess with labs and imaging as indicated at this time.   Procedures ____________________________________________   LABS (pertinent positives/negatives)  Labs Reviewed  HCG, QUANTITATIVE, PREGNANCY - Abnormal; Notable for the following components:       Result Value   hCG, Beta Chain, Quant, S 998 (*)    All other components within normal limits  CBC - Abnormal; Notable for the following components:   WBC 13.3 (*)    RBC 5.71 (*)    Hemoglobin 17.0 (*)    HCT 50.2 (*)    All other components within normal limits   ____________________________________________  DIFFERENTIAL DIAGNOSIS   Miscarriage, anxiety attack, threatened miscarriage  FINAL ASSESSMENT AND PLAN  Miscarriage   Plan: The patient had presented for vaginal bleeding having recently been seen here for same. Patient's labs did indicate likely miscarriage with a significant decrease in her hCG levels. Patient's imaging recently revealed a 5-week intrauterine pregnancy.  She will be prescribed anti-inflammatory medicine as well as anxiolytics.  I will not prescribe narcotics due to her history.   Caitlin DashJohnathan Schneider Williams, MD   Note: This note was generated in part or whole with voice recognition software. Voice recognition is usually quite accurate but there are transcription errors that can and very often do occur. I apologize for any typographical errors that were not detected and corrected.     Caitlin FilbertWilliams, [name], MD 12/29/17 2110

## 2017-12-29 NOTE — ED Notes (Signed)
Lemon swabs given to moisten mouth.

## 2018-01-04 ENCOUNTER — Ambulatory Visit (INDEPENDENT_AMBULATORY_CARE_PROVIDER_SITE_OTHER): Payer: Self-pay | Admitting: Obstetrics and Gynecology

## 2018-01-04 ENCOUNTER — Encounter: Payer: Self-pay | Admitting: Obstetrics and Gynecology

## 2018-01-04 VITALS — BP 108/62 | HR 96 | Ht 62.5 in | Wt 208.0 lb

## 2018-01-04 DIAGNOSIS — O039 Complete or unspecified spontaneous abortion without complication: Secondary | ICD-10-CM

## 2018-01-04 NOTE — Progress Notes (Signed)
Obstetrics & Gynecology Office Visit   Chief Complaint  Patient presents with  . ER follow up    Miscarriage   History of Present Illness: 37 y.o. [id number] female who was seen in the ER on 12/4 and 12/7 due to vaginal bleeding. Originally, she was diagnosed with a threatened miscarriage. On 12/7 she had a significant drop in her hCG levels from 5,997 to 998. The ultrasound obtained on 12/4 only showed a gestational sac consistent with a gestational age of 3741w3d.  No fetal pole nor yolk sac was visualized.  Since her ER visit, she has had intermittent bleeding with occasional passage of clots. She has not been soaking a pad.  Her blood type is O+.  She has been trying for four years. She had her IUD removed to get pregnant and she finally got pregnant.  She is quite upset about this loss.   Past Medical History:  Diagnosis Date  . Anxiety   . Asthma   . Depression   . IV drug user   . Substance abuse [facility](HCC)    Past Surgical History:  Procedure Laterality Date  . CESAREAN SECTION    . INCISION AND DRAINAGE ABSCESS Left 05/31/2016   Procedure: INCISION AND DRAINAGE ABSCESS LEFT ARM;  Surgeon: Ricarda FrameWoodham, Charles, MD;  Location: [facility] ORS;  Service: General;  Laterality: Left;   Gynecologic History: Patient's last menstrual period was 11/04/2017 (exact date).  Obstetric History: [id number] G1 - SAB G2 - SAB G3 - EAB G4 - EAB G5 - 2005 - c-section for failed induction G6 - 2019, most recent or current  Family History  Problem Relation Age of Onset  . COPD Mother   . Diabetes Father   . Kidney disease Father   . Gout Brother   . Pancreatic cancer Maternal Uncle   . Diabetes Paternal Grandmother   . Lung cancer Maternal Uncle     Social History   Socioeconomic History  . Marital status: Single    Spouse name: Not on file  . Number of children: Not on file  . Years of education: Not on file  . Highest education level: Not on file  Occupational History  . Not on file  Social Needs   . Financial resource strain: Not on file  . Food insecurity:    Worry: Not on file    Inability: Not on file  . Transportation needs:    Medical: Not on file    Non-medical: Not on file  Tobacco Use  . Smoking status: Current Every Day Smoker    Packs/day: 0.50    Types: Cigarettes  . Smokeless tobacco: Never Used  Substance and Sexual Activity  . Alcohol use: No  . Drug use: Yes    Types: IV, Cocaine, Methamphetamines    Comment: Last Use Meth- 05/22/16 and Last Use Cocaine- 05/05/16 (Per Patient)  . Sexual activity: Yes  Lifestyle  . Physical activity:    Days per week: Not on file    Minutes per session: Not on file  . Stress: Not on file  Relationships  . Social connections:    Talks on phone: Not on file    Gets together: Not on file    Attends religious service: Not on file    Active member of club or organization: Not on file    Attends meetings of clubs or organizations: Not on file    Relationship status: Not on file  . Intimate partner violence:    Fear of current  or ex partner: Not on file    Emotionally abused: Not on file    Physically abused: Not on file    Forced sexual activity: Not on file  Other Topics Concern  . Not on file  Social History Narrative  . Not on file    No Known Allergies  Prior to Admission medications   Medication Sig Start Date End Date Taking? Authorizing Provider  LORazepam (ATIVAN) 1 MG tablet Take 1 tablet (1 mg total) by mouth 2 (two) times daily. 12/29/17 12/29/18 Yes Emily Filbert, MD  ibuprofen (ADVIL,MOTRIN) 800 MG tablet Take 1 tablet (800 mg total) by mouth every 8 (eight) hours as needed. Patient not taking: Reported on 01/04/2018 12/29/17   Emily Filbert, MD    Review of Systems  Constitutional: Negative.   HENT: Negative.   Eyes: Negative.   Respiratory: Negative.   Cardiovascular: Negative.   Gastrointestinal: Negative.   Genitourinary: Negative.   Musculoskeletal: Negative.   Skin: Negative.     Neurological: Negative.   Psychiatric/Behavioral: Negative.      Physical Exam BP 108/62   Pulse 96   Ht 5' 2.5" (1.588 m)   Wt 208 lb (94.3 kg)   LMP 11/04/2017 (Exact Date)   BMI 37.44 kg/m  Patient's last menstrual period was 11/04/2017 (exact date). Physical Exam Constitutional:      General: She is not in acute distress. HENT:     Head: Normocephalic and atraumatic.  Eyes:     General: No scleral icterus. Neurological:     General: No focal deficit present.     Mental Status: She is alert and oriented to person, place, and time.     Cranial Nerves: No cranial nerve deficit.  Psychiatric:        Mood and Affect: Mood normal.        Behavior: Behavior normal.        Judgment: Judgment normal.     Female chaperone present for pelvic and breast  portions of the physical exam  Assessment: 37 y.o. [id number] female here for  1. Spontaneous abortion      Plan: Problem List Items Addressed This Visit    None    Visit Diagnoses    Spontaneous abortion    -  Primary   Relevant Orders   Beta hCG quant (ref lab)     Trend hCG down again as patient does not quite believe she has had a miscarriage.  Patient reassured. Will have additional testing if levels are not falling.  Rh+.   25 minutes spent in face to face discussion with > 50% spent in counseling,management, and coordination of care of her spontaneous abortion.   Thomasene Mohair, MD 01/04/2018 3:42 PM

## 2018-01-05 LAB — BETA HCG QUANT (REF LAB): hCG Quant: 98 m[IU]/mL

## 2018-01-07 LAB — ABO/RH: ABO/RH(D): O POS

## 2018-03-11 ENCOUNTER — Emergency Department
Admission: EM | Admit: 2018-03-11 | Discharge: 2018-03-11 | Disposition: A | Discharge status: Home / Self Care | Payer: Self-pay | Attending: Emergency Medicine | Admitting: Emergency Medicine

## 2018-03-11 ENCOUNTER — Emergency Department: Payer: Self-pay

## 2018-03-11 ENCOUNTER — Other Ambulatory Visit: Payer: Self-pay

## 2018-03-11 DIAGNOSIS — Z5321 Procedure and treatment not carried out due to patient leaving prior to being seen by health care provider: Secondary | ICD-10-CM | POA: Insufficient documentation

## 2018-03-11 DIAGNOSIS — R05 Cough: Secondary | ICD-10-CM | POA: Insufficient documentation

## 2018-03-11 LAB — BASIC METABOLIC PANEL
Anion gap: 11 (ref 5–15)
BUN: 14 mg/dL (ref 6–20)
CHLORIDE: 107 mmol/L (ref 98–111)
CO2: 19 mmol/L — AB (ref 22–32)
CREATININE: 1.07 mg/dL — AB (ref 0.44–1.00)
Calcium: 9.2 mg/dL (ref 8.9–10.3)
GFR calc Af Amer: >60 mL/min (ref >60)
GFR calc non Af Amer: >60 mL/min (ref >60)
GLUCOSE: 115 mg/dL — AB (ref 70–99)
Potassium: 3.9 mmol/L (ref 3.5–5.1)
Sodium: 137 mmol/L (ref 135–145)

## 2018-03-11 LAB — CBC
HEMATOCRIT: 51.5 % — AB (ref 36.0–46.0)
Hemoglobin: 17.4 g/dL — ABNORMAL HIGH (ref 12.0–15.0)
MCH: 28.7 pg (ref 26.0–34.0)
MCHC: 33.8 g/dL (ref 30.0–36.0)
MCV: 85 fL (ref 80.0–100.0)
NRBC: 0 % (ref 0.0–0.2)
Platelets: 349 10*3/uL (ref 150–400)
RBC: 6.06 MIL/uL — ABNORMAL HIGH (ref 3.87–5.11)
RDW: 13.1 % (ref 11.5–15.5)
WBC: 15 10*3/uL — ABNORMAL HIGH (ref 4.0–10.5)

## 2018-03-11 LAB — LACTIC ACID, PLASMA: LACTIC ACID, VENOUS: 2 mmol/L — AB (ref 0.5–1.9)

## 2018-03-11 LAB — TROPONIN I: Troponin I: <0.03 ng/mL (ref <0.03)

## 2018-03-11 MED ORDER — ALBUTEROL SULFATE (2.5 MG/3ML) 0.083% IN NEBU
5.0000 mg | INHALATION_SOLUTION | Freq: Once | RESPIRATORY_TRACT | Status: AC
  Administered 2018-03-11: 5 mg via RESPIRATORY_TRACT

## 2018-03-11 MED ORDER — ALBUTEROL SULFATE (2.5 MG/3ML) 0.083% IN NEBU
INHALATION_SOLUTION | RESPIRATORY_TRACT | Status: AC
  Administered 2018-03-11: 5 mg via RESPIRATORY_TRACT
  Filled 2018-03-11: qty 6

## 2018-03-11 NOTE — ED Notes (Signed)
Wheezing has improved after tx. Pt continues to have tachypnea.

## 2018-03-11 NOTE — ED Triage Notes (Signed)
Yellow productive cough, chest pain and sob X 2 days. Pt has wheezing at time of triage. Cannot speak in full sentences.

## 2018-03-11 NOTE — ED Notes (Signed)
Pt does not have audible wheezing at this time. RR rate appears to be WDL. Pt lying in recliner with relaxed breathing.

## 2018-03-11 NOTE — ED Notes (Signed)
Pt wants to leave; informed that she needs to be evaluated further but refuses; informed that she has an exam room available now to see the provider but cont to refuse and demands IV be removed; SL d/c'd, cannula intact, dsng applied

## 2018-03-11 NOTE — ED Notes (Signed)
First Nurse aware of patient sx and need for acute bed.

## 2018-03-11 NOTE — ED Notes (Addendum)
Date and time results received: 03/11/18 1737  Test: Lactic Critical Value: 2.0  Name of Provider Notified: Roxan Hockey  Orders Received? Or Actions Taken?: Consulting civil engineer notified

## 2018-03-12 ENCOUNTER — Telehealth: Payer: Self-pay | Admitting: Emergency Medicine

## 2018-03-12 NOTE — Telephone Encounter (Signed)
Called patient due to lwot to inquire about condition and follow up plans.  She says she is still sick and she is taking cough medicine.  I explained that her white count and lactic were up and that we thought she was ill.  She said she could see what we were going to prescribe in mychart and said that would not work.  I explained that there was no decision about what would be prescribed as she never saw a doctor.   I told her that the dr exam was important and that only after that would the doctor make decisions on prescriptions.   I told her she can always return here.

## 2018-07-07 ENCOUNTER — Emergency Department: Payer: Medicaid Other

## 2018-07-07 ENCOUNTER — Emergency Department
Admission: EM | Admit: 2018-07-07 | Discharge: 2018-07-07 | Disposition: A | Discharge status: Home / Self Care | Payer: Medicaid Other | Attending: Emergency Medicine | Admitting: Emergency Medicine

## 2018-07-07 ENCOUNTER — Encounter: Payer: Self-pay | Admitting: Emergency Medicine

## 2018-07-07 DIAGNOSIS — Y929 Unspecified place or not applicable: Secondary | ICD-10-CM | POA: Insufficient documentation

## 2018-07-07 DIAGNOSIS — Z23 Encounter for immunization: Secondary | ICD-10-CM | POA: Insufficient documentation

## 2018-07-07 DIAGNOSIS — J45909 Unspecified asthma, uncomplicated: Secondary | ICD-10-CM | POA: Insufficient documentation

## 2018-07-07 DIAGNOSIS — S0181XA Laceration without foreign body of other part of head, initial encounter: Secondary | ICD-10-CM | POA: Insufficient documentation

## 2018-07-07 DIAGNOSIS — F17210 Nicotine dependence, cigarettes, uncomplicated: Secondary | ICD-10-CM | POA: Insufficient documentation

## 2018-07-07 DIAGNOSIS — Y939 Activity, unspecified: Secondary | ICD-10-CM | POA: Insufficient documentation

## 2018-07-07 DIAGNOSIS — Y999 Unspecified external cause status: Secondary | ICD-10-CM | POA: Insufficient documentation

## 2018-07-07 DIAGNOSIS — S0083XA Contusion of other part of head, initial encounter: Secondary | ICD-10-CM

## 2018-07-07 DIAGNOSIS — S098XXA Other specified injuries of head, initial encounter: Secondary | ICD-10-CM | POA: Insufficient documentation

## 2018-07-07 LAB — POCT PREGNANCY, URINE: Preg Test, Ur: NEGATIVE

## 2018-07-07 MED ORDER — LIDOCAINE-EPINEPHRINE-TETRACAINE (LET) SOLUTION
3.0000 mL | Freq: Once | NASAL | Status: AC
  Administered 2018-07-07: 3 mL via TOPICAL
  Filled 2018-07-07: qty 3

## 2018-07-07 MED ORDER — TETANUS-DIPHTH-ACELL PERTUSSIS 5-2.5-18.5 LF-MCG/0.5 IM SUSP
0.5000 mL | Freq: Once | INTRAMUSCULAR | Status: AC
  Administered 2018-07-07: 0.5 mL via INTRAMUSCULAR
  Filled 2018-07-07: qty 0.5

## 2018-07-07 MED ORDER — ACETAMINOPHEN 500 MG PO TABS
1000.0000 mg | ORAL_TABLET | Freq: Once | ORAL | Status: AC
  Administered 2018-07-07: 1000 mg via ORAL
  Filled 2018-07-07: qty 2

## 2018-07-07 MED ORDER — LIDOCAINE HCL (PF) 1 % IJ SOLN
5.0000 mL | Freq: Once | INTRAMUSCULAR | Status: AC
  Administered 2018-07-07: 5 mL
  Filled 2018-07-07: qty 5

## 2018-07-07 NOTE — Discharge Instructions (Addendum)
Follow-up with [facility] acute care or return to the emergency department for suture removal in 5 days.  Keep the area clean and dry.  Clean with mild soap and water daily and watch for any signs of infection.  You may take Tylenol every 4 hours as needed for your headache however due to your head injury narcotics are contraindicated.
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
## 2018-07-07 NOTE — ED Notes (Signed)
Pt verbalized understanding of discharge instructions. NAD at this time. 

## 2018-08-02 ENCOUNTER — Ambulatory Visit (INDEPENDENT_AMBULATORY_CARE_PROVIDER_SITE_OTHER): Payer: Medicaid Other | Admitting: Obstetrics and Gynecology

## 2018-08-02 ENCOUNTER — Encounter: Payer: Self-pay | Admitting: Obstetrics and Gynecology

## 2018-08-02 ENCOUNTER — Other Ambulatory Visit: Payer: Self-pay

## 2018-08-02 VITALS — BP 122/74 | Ht 62.0 in | Wt 208.0 lb

## 2018-08-02 DIAGNOSIS — N912 Amenorrhea, unspecified: Secondary | ICD-10-CM

## 2018-08-02 MED ORDER — MEDROXYPROGESTERONE ACETATE 10 MG PO TABS: 10.0000 mg | ORAL_TABLET | Freq: Every day | ORAL | 0 refills | Status: DC

## 2018-08-02 NOTE — Progress Notes (Signed)
Obstetrics & Gynecology Office Visit   Chief Complaint  Patient presents with  . Amenorrhea   History of Present Illness: 38 y.o. Schneider female who had a miscarriage in 12/2017.  Since her miscarriage she has had 2 periods.  She has not had a menses in Caitlin months.  She noted a light-pink discharge about 1.Caitlin weeks ago.  When she sees this normally she gets her period about a week after.  She denies any changes in her life.  She denies weight loss. She has gained about 40 pounds over the past few years. She feels bloated and has some ankle swelling.  She has had negative pregnancy tests. She has been attempting pregnancy and has not been on contraception.  She denies changes in her vision.  She notes occasional galactorrhea.  She sees this about a couple times per month.  She notes no changes in her skin and hair.   Past Medical History:  Diagnosis Date  . Anxiety   . Asthma   . Depression   . IV drug user   . Substance abuse [facility])     Past Surgical History:  Procedure Laterality Date  . CESAREAN SECTION    . INCISION AND DRAINAGE ABSCESS Left Caitlin/09/2016   Procedure: INCISION AND DRAINAGE ABSCESS LEFT ARM;  Surgeon: Clayburn Pert, MD;  Location: [facility] ORS;  Service: General;  Laterality: Left;    Gynecologic History: No LMP recorded.  Obstetric History: [id number]  Family History  Problem Relation Age of Onset  . COPD Mother   . Diabetes Father   . Kidney disease Father   . Gout Brother   . Pancreatic cancer Maternal Uncle   . Diabetes Paternal Grandmother   . Lung cancer Maternal Uncle     Social History   Socioeconomic History  . Marital status: Single    Spouse name: Not on file  . Number of children: Not on file  . Years of education: Not on file  . Highest education level: Not on file  Occupational History  . Not on file  Social Needs  . Financial resource strain: Not on file  . Food insecurity    Worry: Not on file    Inability: Not on file  . Transportation  needs    Medical: Not on file    Non-medical: Not on file  Tobacco Use  . Smoking status: Current Every Day Smoker    Packs/day: 0.50    Types: Cigarettes  . Smokeless tobacco: Never Used  Substance and Sexual Activity  . Alcohol use: No  . Drug use: Yes    Types: IV, Cocaine, Methamphetamines    Comment: Last Use Meth- 05/22/16 and Last Use Cocaine- 05/05/16 (Per Patient)  . Sexual activity: Yes    Birth control/protection: None  Lifestyle  . Physical activity    Days per week: Not on file    Minutes per session: Not on file  . Stress: Not on file  Relationships  . Social Herbalist on phone: Not on file    Gets together: Not on file    Attends religious service: Not on file    Active member of club or organization: Not on file    Attends meetings of clubs or organizations: Not on file    Relationship status: Not on file  . Intimate partner violence    Fear of current or ex partner: Not on file    Emotionally abused: Not on file    Physically abused:  Not on file    Forced sexual activity: Not on file  Other Topics Concern  . Not on file  Social History Narrative  . Not on file    No Known Allergies  Prior to Admission medications   Medication Sig Start Date End Date Taking? Authorizing Provider  ibuprofen (ADVIL,MOTRIN) 800 MG tablet Take 1 tablet (800 mg total) by mouth every 8 (eight) hours as needed. 12/29/17  Yes [name], [name], MD  LORazepam (ATIVAN) 1 MG tablet Take 1 tablet (1 mg total) by mouth 2 (two) times daily. Patient not taking: Reported on 08/02/2018 12/29/17 12/29/18  [name], [name], MD    Review of Systems  Constitutional: Negative.   HENT: Negative.   Eyes: Negative.   Respiratory: Negative.   Cardiovascular: Negative.   Gastrointestinal: Negative.   Genitourinary: Negative.   Musculoskeletal: Negative.   Skin: Negative.   Neurological: Negative.   Psychiatric/Behavioral: Negative.     Physical Exam BP 122/74   Ht Caitlin\' 2"   (1.575 m)   Wt 208 lb (94.3 kg)   BMI 38.04 kg/m  No LMP recorded. Physical Exam Constitutional:      General: She is not in acute distress.    Appearance: Normal appearance.  HENT:     Head: Normocephalic and atraumatic.  Eyes:     General: No scleral icterus.    Conjunctiva/sclera: Conjunctivae normal.  Neurological:     General: No focal deficit present.     Mental Status: She is alert and oriented to person, place, and time.     Cranial Nerves: No cranial nerve deficit.  Psychiatric:        Mood and Affect: Mood normal.        Behavior: Behavior normal.        Judgment: Judgment normal.    Female chaperone present for pelvic and breast  portions of the physical exam  Urine Pregnancy Test: negative  Assessment: 38 y.o. [id number] female here for  1. Amenorrhea      Plan: Problem List Items Addressed This Visit    None    Visit Diagnoses    Amenorrhea    -  Primary     Discussed possible causes of amenorrhea and oligomenorrhea. Will start with a progesterone challenge.  Workup pending the outcome of this.    15 minutes spent in face to face discussion with > 50% spent in counseling,management, and coordination of care of her amenorrhea and oligomenorrhea.   Thomasene MohairStephen Rafaella Kole, MD 08/02/2018 1:50 PM

## 2018-11-05 IMAGING — CR DG SHOULDER 2+V*R*
1 series · 3 of 3 positions shown · non-contrast
Comparison: None.

CLINICAL DATA: Fall 4 weeks ago with persistent neck pain and right
shoulder pain.

EXAM:
RIGHT SHOULDER - 2+ VIEW

[Series 1: dg shoulder right · 0.14mm/px · 3 of 3 slices shown]
[im 1/3]
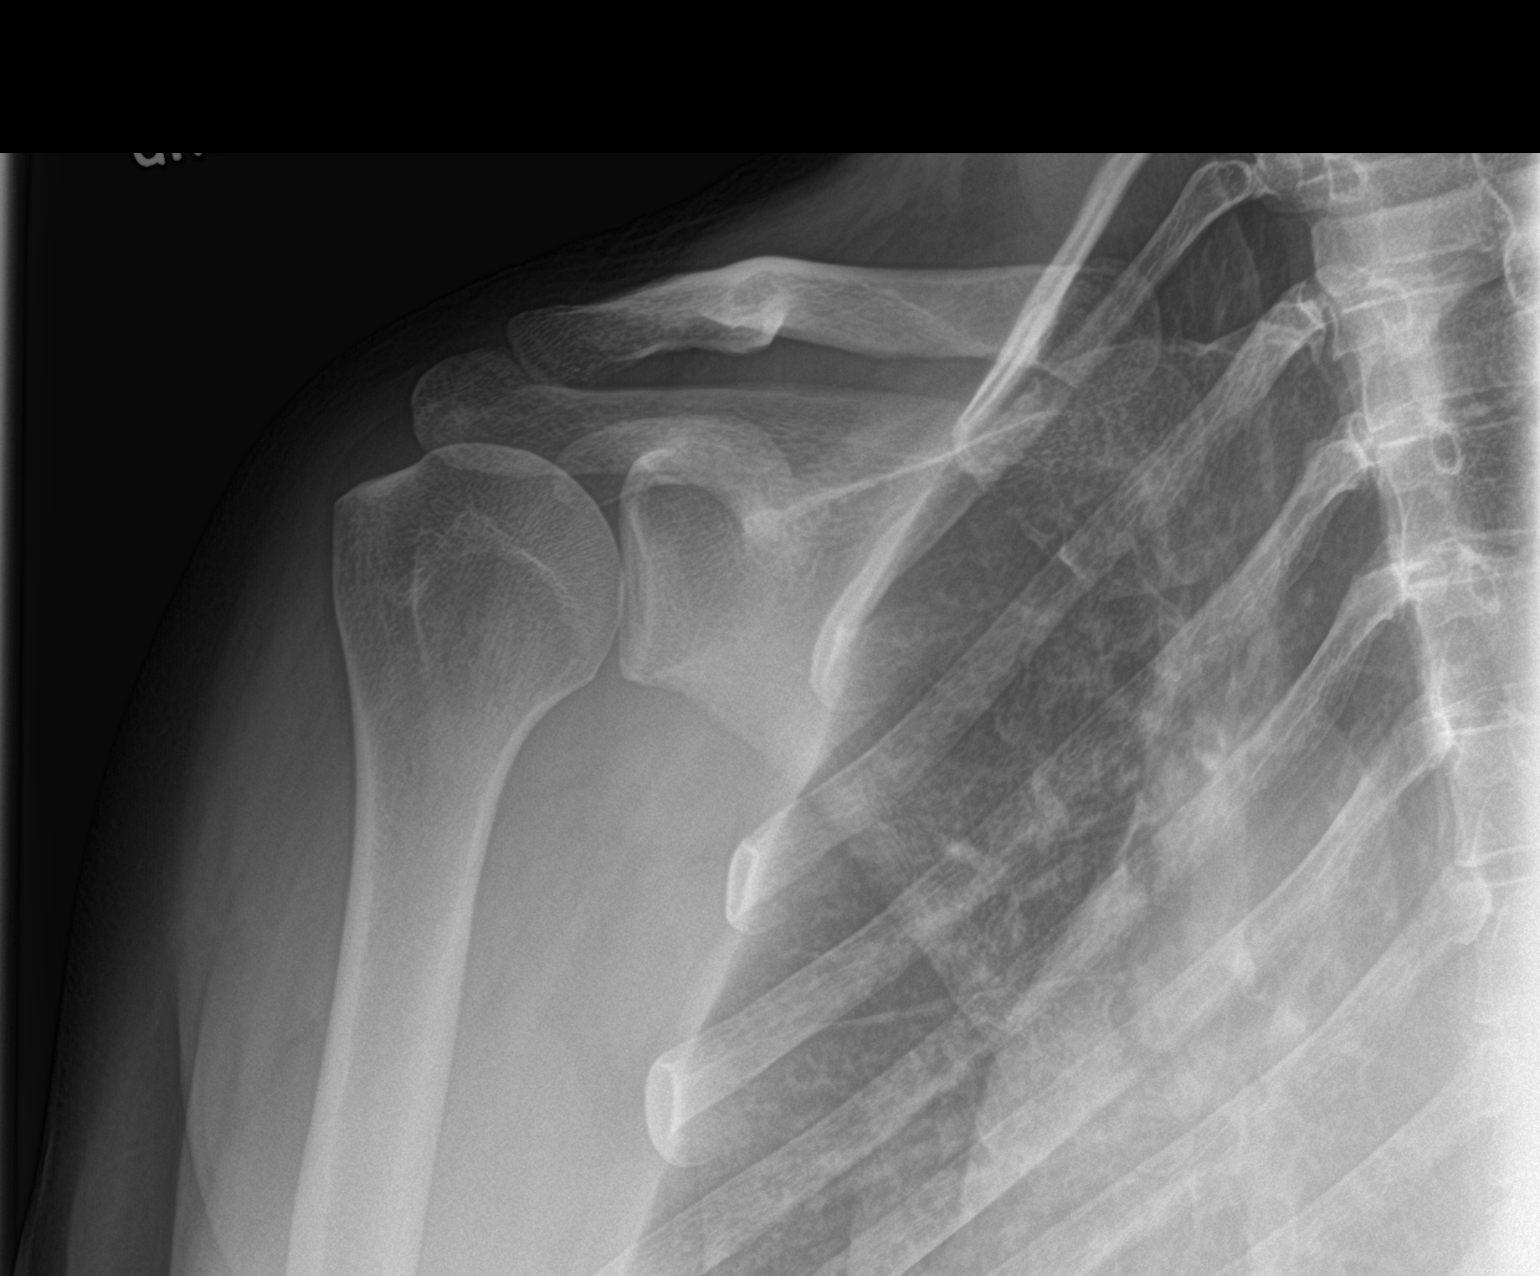
[im 2/3]
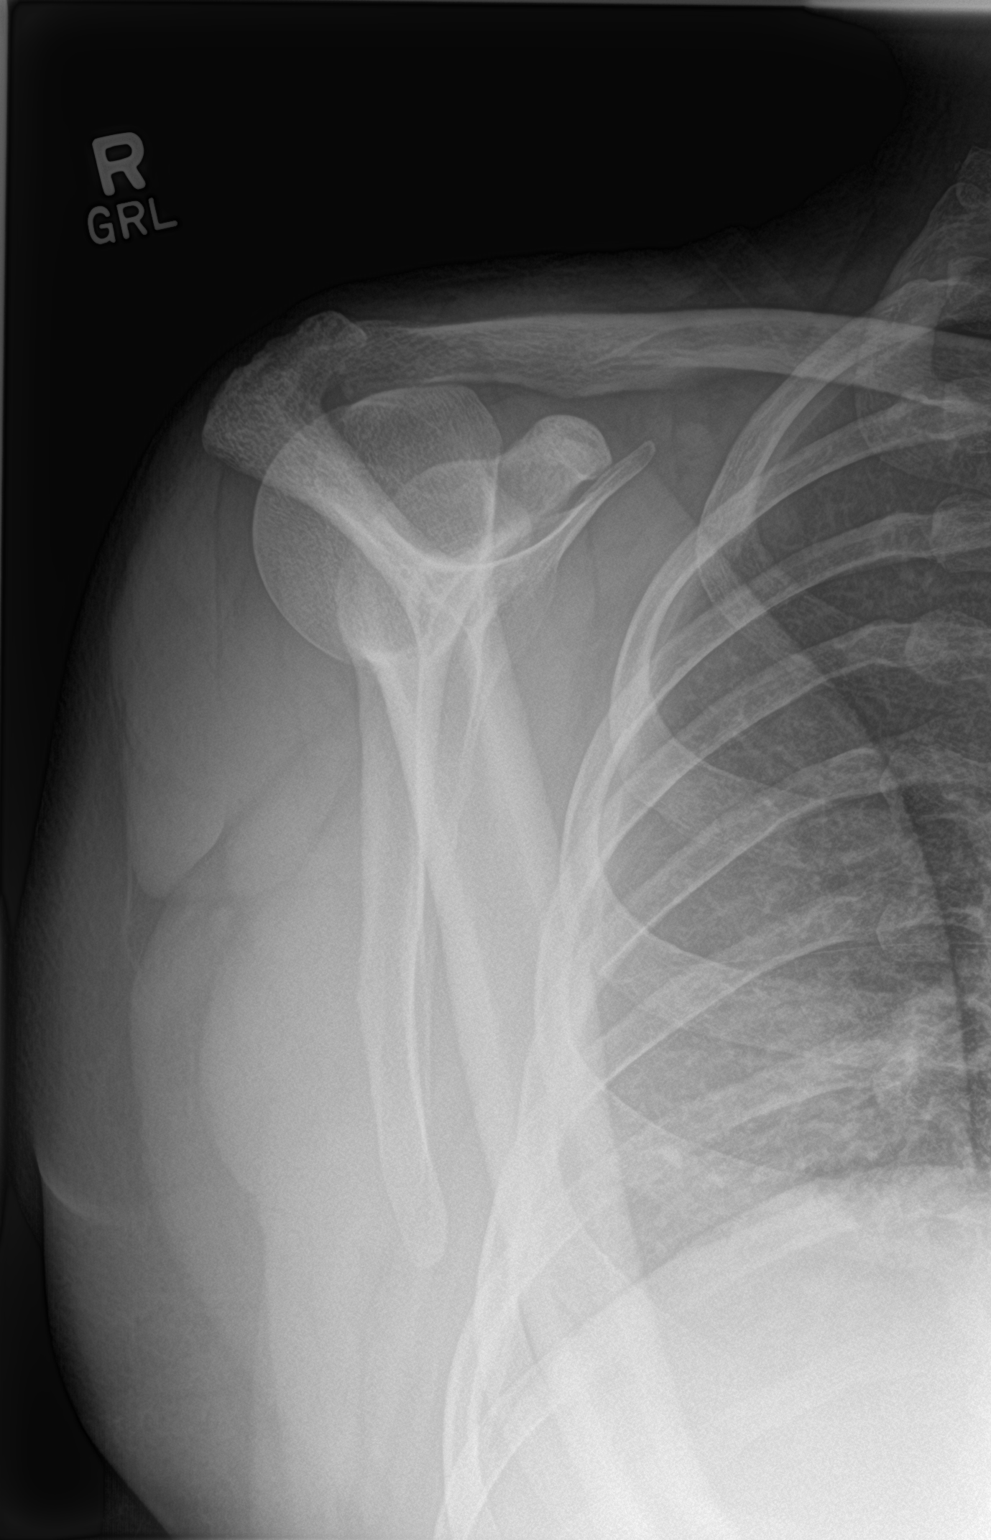
[im 3/3]
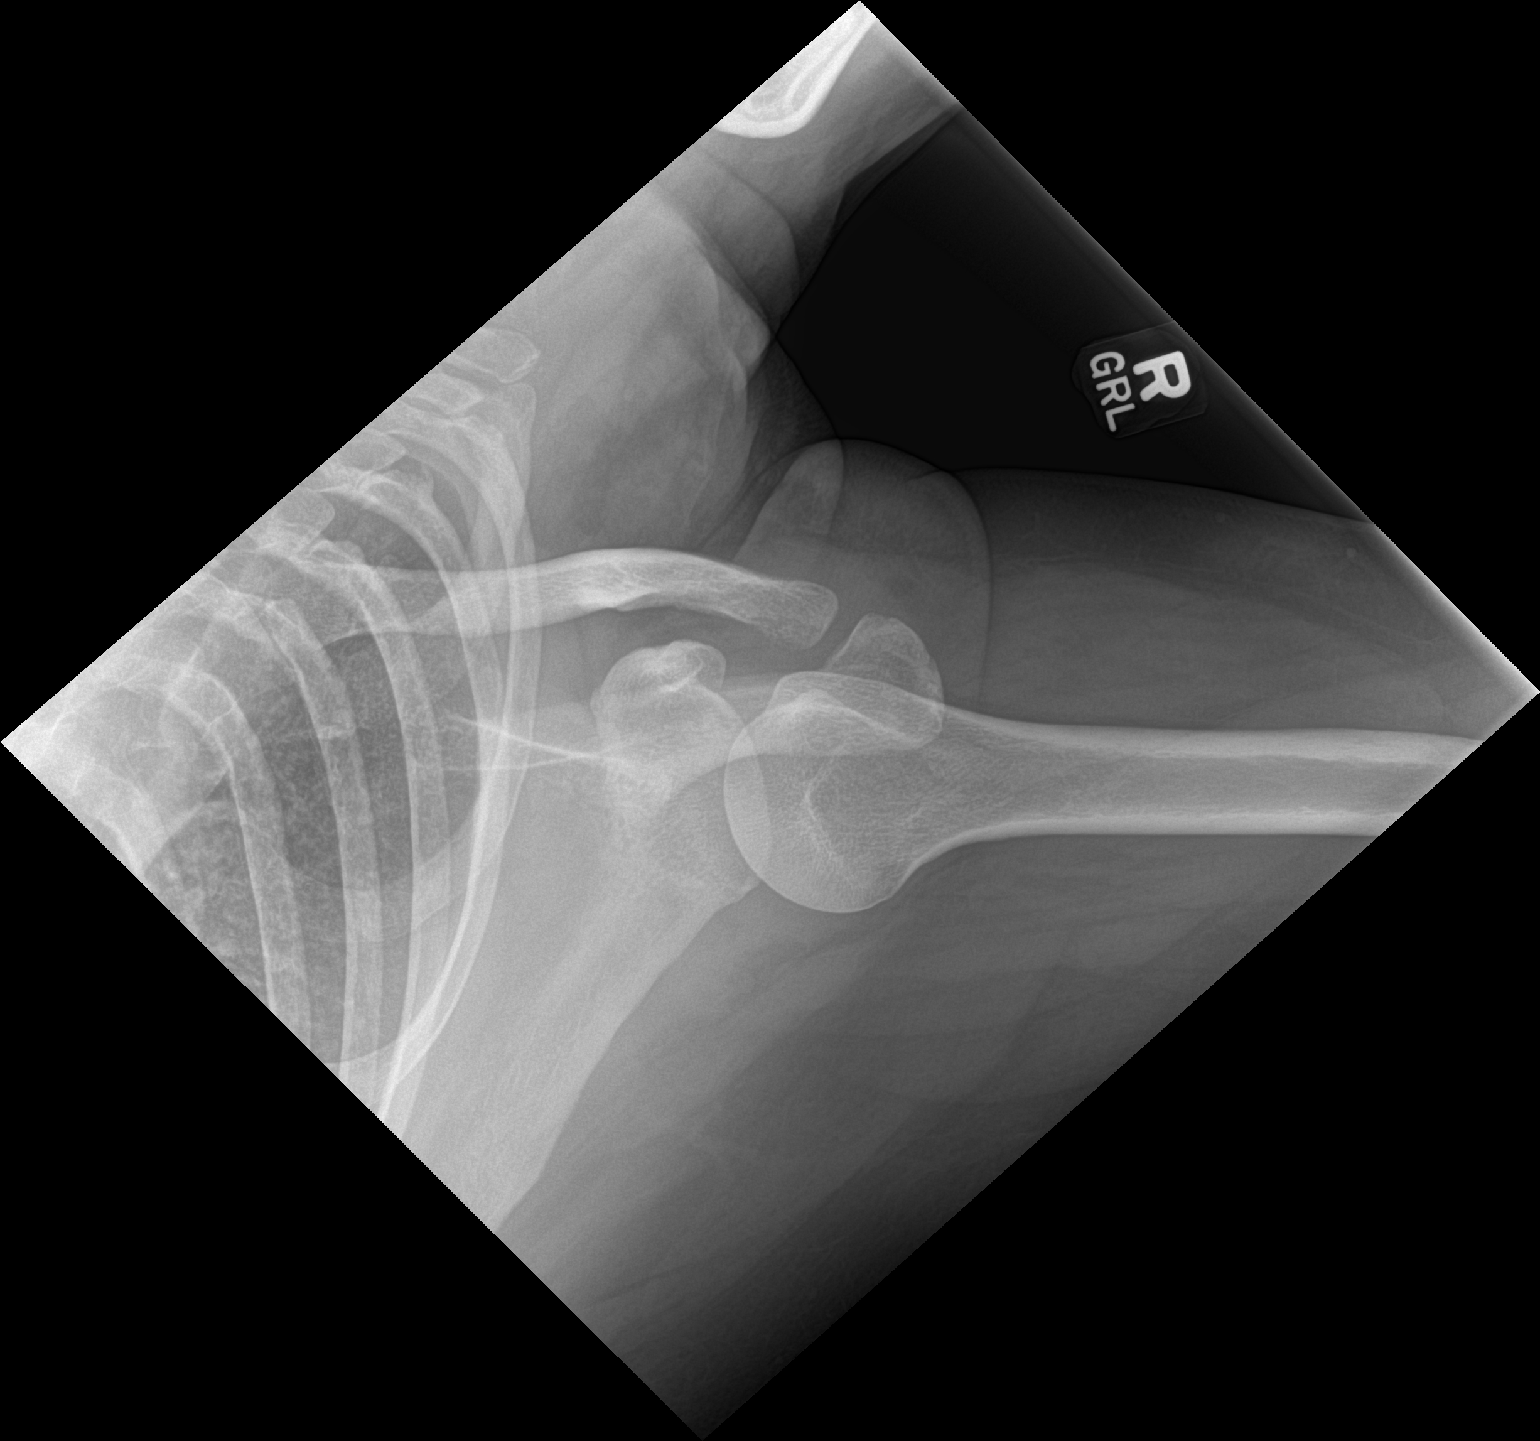

[3 of 3 positions shown; findings below may reference images not displayed]

FINDINGS: There is no evidence of fracture or dislocation. There is no
evidence of arthropathy or other focal bone abnormality. Soft
tissues are unremarkable.
IMPRESSION: Negative.

## 2019-07-27 ENCOUNTER — Other Ambulatory Visit: Payer: Self-pay

## 2019-07-27 ENCOUNTER — Encounter: Payer: Self-pay | Admitting: Emergency Medicine

## 2019-07-27 ENCOUNTER — Emergency Department
Admission: EM | Admit: 2019-07-27 | Discharge: 2019-07-27 | Disposition: A | Discharge status: Home / Self Care | Payer: Self-pay | Attending: Student in an Organized Health Care Education/Training Program | Admitting: Student in an Organized Health Care Education/Training Program

## 2019-07-27 DIAGNOSIS — L03119 Cellulitis of unspecified part of limb: Secondary | ICD-10-CM

## 2019-07-27 DIAGNOSIS — J45909 Unspecified asthma, uncomplicated: Secondary | ICD-10-CM | POA: Insufficient documentation

## 2019-07-27 DIAGNOSIS — B9689 Other specified bacterial agents as the cause of diseases classified elsewhere: Secondary | ICD-10-CM | POA: Insufficient documentation

## 2019-07-27 DIAGNOSIS — L02413 Cutaneous abscess of right upper limb: Secondary | ICD-10-CM | POA: Insufficient documentation

## 2019-07-27 DIAGNOSIS — L03113 Cellulitis of right upper limb: Secondary | ICD-10-CM | POA: Insufficient documentation

## 2019-07-27 DIAGNOSIS — F17210 Nicotine dependence, cigarettes, uncomplicated: Secondary | ICD-10-CM | POA: Insufficient documentation

## 2019-07-27 LAB — HCG, QUANTITATIVE, PREGNANCY: hCG, Beta Chain, Quant, S: 1 m[IU]/mL (ref <5)

## 2019-07-27 LAB — CBC WITH DIFFERENTIAL/PLATELET
Abs Immature Granulocytes: 0.1 10*3/uL — ABNORMAL HIGH (ref 0.00–0.07)
Basophils Absolute: 0.1 10*3/uL (ref 0.0–0.1)
Basophils Relative: 1 %
Eosinophils Absolute: 0.3 10*3/uL (ref 0.0–0.5)
Eosinophils Relative: 3 %
HCT: 45.7 % (ref 36.0–46.0)
Hemoglobin: 15.9 g/dL — ABNORMAL HIGH (ref 12.0–15.0)
Immature Granulocytes: 1 %
Lymphocytes Relative: 35 %
Lymphs Abs: 3.9 10*3/uL (ref 0.7–4.0)
MCH: 29.2 pg (ref 26.0–34.0)
MCHC: 34.8 g/dL (ref 30.0–36.0)
MCV: 84 fL (ref 80.0–100.0)
Monocytes Absolute: 0.5 10*3/uL (ref 0.1–1.0)
Monocytes Relative: 5 %
Neutro Abs: 6.3 10*3/uL (ref 1.7–7.7)
Neutrophils Relative %: 55 %
Platelets: 309 10*3/uL (ref 150–400)
RBC: 5.44 MIL/uL — ABNORMAL HIGH (ref 3.87–5.11)
RDW: 14 % (ref 11.5–15.5)
WBC: 11.3 10*3/uL — ABNORMAL HIGH (ref 4.0–10.5)
nRBC: 0 % (ref 0.0–0.2)

## 2019-07-27 LAB — BASIC METABOLIC PANEL
Anion gap: 8 (ref 5–15)
BUN: 21 mg/dL — ABNORMAL HIGH (ref 6–20)
CO2: 23 mmol/L (ref 22–32)
Calcium: 8.4 mg/dL — ABNORMAL LOW (ref 8.9–10.3)
Chloride: 107 mmol/L (ref 98–111)
Creatinine, Ser: 0.93 mg/dL (ref 0.44–1.00)
GFR calc Af Amer: >60 mL/min (ref >60)
GFR calc non Af Amer: >60 mL/min (ref >60)
Glucose, Bld: 101 mg/dL — ABNORMAL HIGH (ref 70–99)
Potassium: 4.3 mmol/L (ref 3.5–5.1)
Sodium: 138 mmol/L (ref 135–145)

## 2019-07-27 MED ORDER — SULFAMETHOXAZOLE-TRIMETHOPRIM 800-160 MG PO TABS: 1.0000 | ORAL_TABLET | Freq: Two times a day (BID) | ORAL | 0 refills | Status: AC

## 2019-07-27 MED ORDER — SULFAMETHOXAZOLE-TRIMETHOPRIM 800-160 MG PO TABS
1.0000 | ORAL_TABLET | Freq: Once | ORAL | Status: AC
  Administered 2019-07-27: 1 via ORAL
  Filled 2019-07-27: qty 1

## 2019-07-27 MED ORDER — OXYCODONE-ACETAMINOPHEN 5-325 MG PO TABS
1.0000 | ORAL_TABLET | Freq: Once | ORAL | Status: AC
  Administered 2019-07-27: 1 via ORAL
  Filled 2019-07-27: qty 1

## 2019-07-27 MED ORDER — LIDOCAINE HCL (PF) 1 % IJ SOLN
5.0000 mL | Freq: Once | INTRAMUSCULAR | Status: AC
  Administered 2019-07-27: 5 mL via INTRADERMAL
  Filled 2019-07-27: qty 5

## 2019-07-27 NOTE — ED Provider Notes (Signed)
[facility] Emergency Department Provider Note    First MD Initiated Contact with Patient 07/27/19 [phone]     (approximate)  I have reviewed the triage vital signs and the nursing notes.   HISTORY  Chief Complaint Abscess    HPI Caitlin Schneider is a 39 y.o. female below listed past medical history presents to the ER for evaluation of 3 days of right forearm swelling and pain.  States that she thinks it was a bug bite.  Denies any injection but does have a history of IV drug abuse.  Not been on antibiotics.  Denies any allergies.    Past Medical History:  Diagnosis Date  . Anxiety   . Asthma   . Depression   . IV drug user   . Substance abuse (HCC)    Family History  Problem Relation Age of Onset  . COPD Mother   . Diabetes Father   . Kidney disease Father   . Gout Brother   . Pancreatic cancer Maternal Uncle   . Diabetes Paternal Grandmother   . Lung cancer Maternal Uncle    Past Surgical History:  Procedure Laterality Date  . CESAREAN SECTION    . INCISION AND DRAINAGE ABSCESS Left 05/31/2016   Procedure: INCISION AND DRAINAGE ABSCESS LEFT ARM;  Surgeon: Ricarda Frame, MD;  Location: [facility] ORS;  Service: General;  Laterality: Left;   Patient Active Problem List   Diagnosis Date Noted  . Left arm cellulitis   . Abscess   . Sepsis (HCC) 05/30/2016  . Cellulitis 05/30/2016  . IV drug user 05/30/2016      Prior to Admission medications   Medication Sig Start Date End Date Taking? Authorizing Provider  ibuprofen (ADVIL,MOTRIN) 800 MG tablet Take 1 tablet (800 mg total) by mouth every 8 (eight) hours as needed. 12/29/17   Emily Filbert, MD  medroxyPROGESTERone (PROVERA) 10 MG tablet Take 1 tablet (10 mg total) by mouth daily for 10 days. 08/02/18 08/12/18  Conard Novak, MD  sulfamethoxazole-trimethoprim (BACTRIM DS) 800-160 MG tablet Take 1 tablet by mouth 2 (two) times daily for 7 days. 07/27/19 08/03/19  Willy Eddy, MD     Allergies Patient has no known allergies.    Social History Social History   Tobacco Use  . Smoking status: Current Every Day Smoker    Packs/day: 0.50    Types: Cigarettes  . Smokeless tobacco: Never Used  Vaping Use  . Vaping Use: Never used  Substance Use Topics  . Alcohol use: No  . Drug use: Yes    Types: IV, Cocaine, Methamphetamines    Comment: Last Use Meth- 05/22/16 and Last Use Cocaine- 05/05/16 (Per Patient)    Review of Systems Patient denies headaches, rhinorrhea, blurry vision, numbness, shortness of breath, chest pain, edema, cough, abdominal pain, nausea, vomiting, diarrhea, dysuria, fevers, rashes or hallucinations unless otherwise stated above in HPI. ____________________________________________   PHYSICAL EXAM:  VITAL SIGNS: Vitals:   07/27/19 0201 07/27/19 0533  BP: (!) 99/54 92/62  Pulse:  80  Resp:  20  Temp:    SpO2:  94%    Constitutional: Alert and oriented.  Eyes: Conjunctivae are normal.  Head: Atraumatic. Nose: No congestion/rhinnorhea. Mouth/Throat: Mucous membranes are moist.   Neck: No stridor. Painless ROM.  Cardiovascular: Normal rate, regular rhythm. Grossly normal heart sounds.  Good peripheral circulation. Respiratory: Normal respiratory effort.  No retractions. Lungs CTAB. Gastrointestinal: Soft and nontender. No distention. No abdominal bruits. No CVA tenderness. Genitourinary:  Musculoskeletal: 5 cm area of erythema and tenderness palpation of the right volar forearm with roughly 2 cm area of fluctuance consistent with abscess.  No crepitus.  Form compartment is soft.  No lower extremity tenderness nor edema.  No joint effusions. Neurologic:  Normal speech and language. No gross focal neurologic deficits are appreciated. No facial droop Skin:  Skin is warm, dry and intact. No rash noted. Psychiatric: Mood and affect are normal. Speech and behavior are normal.  ____________________________________________   LABS (all  labs ordered are listed, but only abnormal results are displayed)  Results for orders placed or performed during the hospital encounter of 07/27/19 (from the past 24 hour(s))  CBC with Differential     Status: Abnormal   Collection Time: 07/27/19  2:08 AM  Result Value Ref Range   WBC 11.3 (H) 4.0 - 10.5 K/uL   RBC 5.44 (H) 3.87 - 5.11 MIL/uL   Hemoglobin 15.9 (H) 12.0 - 15.0 g/dL   HCT 19.1 36 - 46 %   MCV 84.0 80.0 - 100.0 fL   MCH 29.2 26.0 - 34.0 pg   MCHC 34.8 30.0 - 36.0 g/dL   RDW 47.8 29.5 - 62.1 %   Platelets 309 150 - 400 K/uL   nRBC 0.0 0.0 - 0.2 %   Neutrophils Relative % 55 %   Neutro Abs 6.3 1.7 - 7.7 K/uL   Lymphocytes Relative 35 %   Lymphs Abs 3.9 0.7 - 4.0 K/uL   Monocytes Relative 5 %   Monocytes Absolute 0.5 0 - 1 K/uL   Eosinophils Relative 3 %   Eosinophils Absolute 0.3 0 - 0 K/uL   Basophils Relative 1 %   Basophils Absolute 0.1 0 - 0 K/uL   Immature Granulocytes 1 %   Abs Immature Granulocytes 0.10 (H) 0.00 - 0.07 K/uL  Basic metabolic panel     Status: Abnormal   Collection Time: 07/27/19  2:08 AM  Result Value Ref Range   Sodium 138 135 - 145 mmol/L   Potassium 4.3 3.5 - 5.1 mmol/L   Chloride 107 98 - 111 mmol/L   CO2 23 22 - 32 mmol/L   Glucose, Bld 101 (H) 70 - 99 mg/dL   BUN 21 (H) 6 - 20 mg/dL   Creatinine, Ser 3.08 0.44 - 1.00 mg/dL   Calcium 8.4 (L) 8.9 - 10.3 mg/dL   GFR calc non Af Amer >60 >60 mL/min   GFR calc Af Amer >60 >60 mL/min   Anion gap 8 5 - 15  hCG, quantitative, pregnancy     Status: None   Collection Time: 07/27/19  2:08 AM  Result Value Ref Range   hCG, Beta Chain, Quant, S 1 <5 mIU/mL   ____________________________________________ ____________________________________________  RADIOLOGY  EMERGENCY DEPARTMENT US SOFT TISSUE INTERPRETATION "Study: Limited Soft Tissue Ultrasound"  INDICATIONS: Soft tissue infection Multiple views of the body part were obtained in real-time with a multi-frequency linear  probe  PERFORMED BY: Myself IMAGES ARCHIVED?: No SIDE:Right  BODY PART:Upper extremity INTERPRETATION:  Abcess present and Cellulitis present    ____________________________________________   PROCEDURES  Procedure(s) performed:  Marland KitchenMarland KitchenIncision and Drainage  Date/Time: 07/27/2019 6:57 AM Performed by: Willy Eddy, MD Authorized by: Willy Eddy, MD   Consent:    Consent obtained:  Verbal   Consent given by:  Patient   Risks discussed:  Bleeding, infection, incomplete drainage and pain   Alternatives discussed:  Alternative treatment, delayed treatment and observation Location:    Type:  Abscess  Location:  Upper extremity   Upper extremity location:  Arm   Arm location:  R lower arm Pre-procedure details:    Skin preparation:  Antiseptic wash Anesthesia (see MAR for exact dosages):    Anesthesia method:  Local infiltration   Local anesthetic:  Lidocaine 1% w/o epi Procedure type:    Complexity:  Simple Procedure details:    Incision types:  Stab incision   Incision depth:  Subcutaneous   Scalpel blade:  11   Wound management:  Probed and deloculated   Drainage:  Purulent   Drainage amount:  Moderate   Wound treatment:  Wound left open   Packing materials:  None Post-procedure details:    Patient tolerance of procedure:  Tolerated well, no immediate complications      Critical Care performed: no ____________________________________________   INITIAL IMPRESSION / ASSESSMENT AND PLAN / ED COURSE  Pertinent labs & imaging results that were available during my care of the patient were reviewed by me and considered in my medical decision making (see chart for details).   DDX: abscess, cellulitis, ivda, sepsis, electrolyte abn  Emilya Justen is a 39 y.o. who presents to the ED with abscess and cellulitis as described above.  Patient is AFVSS in ED. Exam as above. Given current presentation have considered the above differential.  Presentation is  consistent with abscess with cellulitis.  She is nontoxic-appearing well-perfused.  Afebrile.  No significant leukocytosis.  Will give oral antibiotic and I&D.  Clinical Course as of Jul 26 657  Wynelle Link Jul 27, 2019  2025 Patient tolerated I&D of forearm abscess without complication.  She is well perfused and hemodynamically stable.  No signs of sepsis.  Is tolerating oral antibiotic.  Instructed patient should return in 48 hours for wound check and sooner if worsening pain develops, fever or enema ability to tolerate antibiotic.   [PR]    Clinical Course User Index [PR] Willy Eddy, MD    The patient was evaluated in Emergency Department today for the symptoms described in the history of present illness. He/she was evaluated in the context of the global COVID-19 pandemic, which necessitated consideration that the patient might be at risk for infection with the SARS-CoV-2 virus that causes COVID-19. Institutional protocols and algorithms that pertain to the evaluation of patients at risk for COVID-19 are in a state of rapid change based on information released by regulatory bodies including the CDC and federal and state organizations. These policies and algorithms were followed during the patient's care in the ED.  As part of my medical decision making, I reviewed the following data within the electronic MEDICAL RECORD NUMBER Nursing notes reviewed and incorporated, Labs reviewed, notes from prior ED visits and [place] Controlled Substance Database   ____________________________________________   FINAL CLINICAL IMPRESSION(S) / ED DIAGNOSES  Final diagnoses:  Cellulitis and abscess of upper extremity      NEW MEDICATIONS STARTED DURING THIS VISIT:  New Prescriptions   SULFAMETHOXAZOLE-TRIMETHOPRIM (BACTRIM DS) 800-160 MG TABLET    Take 1 tablet by mouth 2 (two) times daily for 7 days.     Note:  This document was prepared using Dragon voice recognition software and may include unintentional  dictation errors.    Willy Eddy, MD 07/27/19 [phone]

## 2019-07-27 NOTE — ED Triage Notes (Signed)
Pt states that she was bitten by something "possibly a spider" on Thursday and yesterday she squeezed the area and got a white DC. Pt has noted red area to right forearm and denies any injury or trauma. Pt is in NAD.

## 2019-07-27 NOTE — ED Notes (Signed)
Pt thinks she may have been bitten by a spider or other insect on Thursday night.  Pt noticed a spot on her right forearm Friday morning.  Since then, the area has become swollen, red, and painful.  Pt reports pain is constant and rates it 10/10 at this time.

## 2019-07-29 DIAGNOSIS — F17210 Nicotine dependence, cigarettes, uncomplicated: Secondary | ICD-10-CM | POA: Insufficient documentation

## 2019-07-29 DIAGNOSIS — J45909 Unspecified asthma, uncomplicated: Secondary | ICD-10-CM | POA: Insufficient documentation

## 2019-07-29 DIAGNOSIS — Z4800 Encounter for change or removal of nonsurgical wound dressing: Secondary | ICD-10-CM | POA: Insufficient documentation

## 2019-07-29 NOTE — ED Triage Notes (Signed)
Pt to ED reports a wound to the right forearm. On 7/2 pt noticed wound. Pt returning tonight because she was told to return in 2 days to assess if wound needed cleaning again. Wound continues to have yellow discharge and redness around the wound site with continued pain reported.

## 2019-07-30 ENCOUNTER — Other Ambulatory Visit: Payer: Self-pay

## 2019-07-30 ENCOUNTER — Emergency Department
Admission: EM | Admit: 2019-07-30 | Discharge: 2019-07-30 | Disposition: A | Discharge status: Home / Self Care | Payer: Medicaid Other | Attending: Emergency Medicine | Admitting: Emergency Medicine

## 2019-07-30 DIAGNOSIS — Z5189 Encounter for other specified aftercare: Secondary | ICD-10-CM

## 2019-07-30 MED ORDER — LIDOCAINE HCL (PF) 1 % IJ SOLN
5.0000 mL | Freq: Once | INTRAMUSCULAR | Status: AC
  Administered 2019-07-30: 5 mL
  Filled 2019-07-30: qty 5

## 2019-07-30 MED ORDER — NAPROXEN 500 MG PO TABS: 500.0000 mg | ORAL_TABLET | Freq: Two times a day (BID) | ORAL | 0 refills | Status: DC

## 2019-07-30 MED ORDER — NAPROXEN 500 MG PO TABS
500.0000 mg | ORAL_TABLET | Freq: Once | ORAL | Status: AC
  Administered 2019-07-30: 500 mg via ORAL
  Filled 2019-07-30: qty 1

## 2019-07-30 MED ORDER — LIDOCAINE-EPINEPHRINE-TETRACAINE (LET) TOPICAL GEL
3.0000 mL | Freq: Once | TOPICAL | Status: AC
  Administered 2019-07-30: 3 mL via TOPICAL
  Filled 2019-07-30: qty 3

## 2019-07-30 NOTE — Discharge Instructions (Addendum)
Continue previous medication return back in 2 days to have dressing change and packing removed.

## 2019-07-30 NOTE — ED Triage Notes (Signed)
Pt called from WR to treatment room, no response 

## 2019-07-30 NOTE — ED Provider Notes (Signed)
[facility] Emergency Department Provider Note   ____________________________________________   First MD Initiated Contact with Patient 07/30/19 [phone]     (approximate)  I have reviewed the triage vital signs and the nursing notes.   HISTORY  Chief Complaint Wound Check    HPI Caitlin Schneider is a 39 y.o. female patient returns for recheck status post I&D of an abscess to the right forearm.  Patient states wound continues to have drainage.  Patient denies loss sensation or loss of function.  Patient states no fever.  Rates her pain as 8/10.         Past Medical History:  Diagnosis Date  . Anxiety   . Asthma   . Depression   . IV drug user   . Substance abuse [facility])     Patient Active Problem List   Diagnosis Date Noted  . Left arm cellulitis   . Abscess   . Sepsis (HCC) 05/30/2016  . Cellulitis 05/30/2016  . IV drug user 05/30/2016    Past Surgical History:  Procedure Laterality Date  . CESAREAN SECTION    . INCISION AND DRAINAGE ABSCESS Left 05/31/2016   Procedure: INCISION AND DRAINAGE ABSCESS LEFT ARM;  Surgeon: Ricarda Frame, MD;  Location: [facility] ORS;  Service: General;  Laterality: Left;    Prior to Admission medications   Medication Sig Start Date End Date Taking? Authorizing Provider  ibuprofen (ADVIL,MOTRIN) 800 MG tablet Take 1 tablet (800 mg total) by mouth every 8 (eight) hours as needed. 12/29/17   Emily Filbert, MD  medroxyPROGESTERone (PROVERA) 10 MG tablet Take 1 tablet (10 mg total) by mouth daily for 10 days. 08/02/18 08/12/18  Conard Novak, MD  naproxen (NAPROSYN) 500 MG tablet Take 1 tablet (500 mg total) by mouth 2 (two) times daily with a meal. 07/30/19   Joni Reining, PA-C  sulfamethoxazole-trimethoprim (BACTRIM DS) 800-160 MG tablet Take 1 tablet by mouth 2 (two) times daily for 7 days. 07/27/19 08/03/19  Willy Eddy, MD    Allergies Patient has no known allergies.  Family History  Problem Relation  Age of Onset  . COPD Mother   . Diabetes Father   . Kidney disease Father   . Gout Brother   . Pancreatic cancer Maternal Uncle   . Diabetes Paternal Grandmother   . Lung cancer Maternal Uncle     Social History Social History   Tobacco Use  . Smoking status: Current Every Day Smoker    Packs/day: 0.50    Types: Cigarettes  . Smokeless tobacco: Never Used  Vaping Use  . Vaping Use: Never used  Substance Use Topics  . Alcohol use: No  . Drug use: Yes    Types: IV, Cocaine, Methamphetamines    Comment: Last Use Meth- 05/22/16 and Last Use Cocaine- 05/05/16 (Per Patient)    Review of Systems Constitutional: No fever/chills Eyes: No visual changes. ENT: No sore throat. Cardiovascular: Denies chest pain. Respiratory: Denies shortness of breath. Gastrointestinal: No abdominal pain.  No nausea, no vomiting.  No diarrhea.  No constipation. Genitourinary: Negative for dysuria. Musculoskeletal: Negative for back pain. Skin: Abscess right forearm.   Neurological: Negative for headaches, focal weakness or numbness. Psychiatric:  Anxiety, depression, substance abuse.    ____________________________________________   PHYSICAL EXAM:  VITAL SIGNS: ED Triage Vitals  Enc Vitals Group     BP 07/29/19 2238 (!) 126/98     Pulse Rate 07/29/19 2238 100     Resp 07/29/19 2238 16  Temp 07/29/19 2238 98.3 F (36.8 C)     Temp Source 07/29/19 2238 Oral     SpO2 07/29/19 2238 99 %     Weight 07/29/19 2239 207 lb 14.3 oz (94.3 kg)     Height 07/29/19 2239 5\' 2"  (1.575 m)     Head Circumference --      Peak Flow --      Pain Score 07/29/19 2239 8     Pain Loc --      Pain Edu? --      Excl. in GC? --     Constitutional: Alert and oriented. Well appearing and in no acute distress.  Anxious/crying Cardiovascular: Normal rate, regular rhythm. Grossly normal heart sounds.  Good peripheral circulation. Respiratory: Normal respiratory effort.  No retractions. Lungs CTAB. Skin:  Receding erythema right forearm.  Mild drainage from incision site. ___________________________________________   LABS (all labs ordered are listed, but only abnormal results are displayed)  Labs Reviewed - No data to display ____________________________________________  EKG   ____________________________________________  RADIOLOGY  ED MD interpretation:    Official radiology report(s): No results found.  ____________________________________________   PROCEDURES  Procedure(s) performed (including Critical Care):  Procedures   ____________________________________________   INITIAL IMPRESSION / ASSESSMENT AND PLAN / ED COURSE  As part of my medical decision making, I reviewed the following data within the electronic MEDICAL RECORD NUMBER     Patient returns status post I&D 2 days ago.  Patient continues have drainage.  Packing material was not visible when removing dressing.  Wound was irrigated and repacked.  Patient advised continue previous medications given prescription for naproxen.  Return back in 2 days for reevaluation.    Caitlin Schneider was evaluated in Emergency Department on 07/30/2019 for the symptoms described in the history of present illness. She was evaluated in the context of the global COVID-19 pandemic, which necessitated consideration that the patient might be at risk for infection with the SARS-CoV-2 virus that causes COVID-19. Institutional protocols and algorithms that pertain to the evaluation of patients at risk for COVID-19 are in a state of rapid change based on information released by regulatory bodies including the CDC and federal and state organizations. These policies and algorithms were followed during the patient's care in the ED.       ____________________________________________   FINAL CLINICAL IMPRESSION(S) / ED DIAGNOSES  Final diagnoses:  Encounter for wound re-check     ED Discharge Orders         Ordered    naproxen (NAPROSYN)  500 MG tablet  2 times daily with meals     Discontinue  Reprint     07/30/19 0851           Note:  This document was prepared using Dragon voice recognition software and may include unintentional dictation errors.    09/30/19, PA-C 07/30/19 09/30/19    [id number], MD 08/01/19 1344

## 2019-07-30 NOTE — ED Notes (Signed)
Pt called in the WR and outside at the front entrance with no response

## 2019-07-30 NOTE — ED Notes (Signed)
Called   No answer in lobby  

## 2019-07-30 NOTE — ED Notes (Signed)
See triage note  Presents for wound check to right forearm  States was seen 2 days ago  Had area lanced

## 2019-08-12 ENCOUNTER — Telehealth: Payer: Self-pay | Admitting: General Practice

## 2019-08-12 NOTE — Telephone Encounter (Signed)
Individual has been contacted 3+ times regarding ED referral. No further attempts to contact individual will be made. 

## 2019-11-15 ENCOUNTER — Other Ambulatory Visit: Payer: Self-pay

## 2019-11-15 ENCOUNTER — Emergency Department
Admission: EM | Admit: 2019-11-15 | Discharge: 2019-11-16 | Disposition: A | Discharge status: Home / Self Care | Payer: Medicaid Other | Attending: Student in an Organized Health Care Education/Training Program | Admitting: Student in an Organized Health Care Education/Training Program

## 2019-11-15 DIAGNOSIS — F1594 Other stimulant use, unspecified with stimulant-induced mood disorder: Secondary | ICD-10-CM | POA: Diagnosis present

## 2019-11-15 DIAGNOSIS — J45909 Unspecified asthma, uncomplicated: Secondary | ICD-10-CM | POA: Insufficient documentation

## 2019-11-15 DIAGNOSIS — F17210 Nicotine dependence, cigarettes, uncomplicated: Secondary | ICD-10-CM | POA: Insufficient documentation

## 2019-11-15 DIAGNOSIS — F1910 Other psychoactive substance abuse, uncomplicated: Secondary | ICD-10-CM | POA: Insufficient documentation

## 2019-11-15 DIAGNOSIS — Z20822 Contact with and (suspected) exposure to covid-19: Secondary | ICD-10-CM | POA: Insufficient documentation

## 2019-11-15 DIAGNOSIS — F329 Major depressive disorder, single episode, unspecified: Secondary | ICD-10-CM | POA: Insufficient documentation

## 2019-11-15 DIAGNOSIS — R45851 Suicidal ideations: Secondary | ICD-10-CM | POA: Insufficient documentation

## 2019-11-15 DIAGNOSIS — R4589 Other symptoms and signs involving emotional state: Secondary | ICD-10-CM

## 2019-11-15 LAB — CBC WITH DIFFERENTIAL/PLATELET
Abs Immature Granulocytes: 0.04 10*3/uL (ref 0.00–0.07)
Basophils Absolute: 0.1 10*3/uL (ref 0.0–0.1)
Basophils Relative: 1 %
Eosinophils Absolute: 0.1 10*3/uL (ref 0.0–0.5)
Eosinophils Relative: 1 %
HCT: 47.7 % — ABNORMAL HIGH (ref 36.0–46.0)
Hemoglobin: 16.3 g/dL — ABNORMAL HIGH (ref 12.0–15.0)
Immature Granulocytes: 0 %
Lymphocytes Relative: 13 %
Lymphs Abs: 1.7 10*3/uL (ref 0.7–4.0)
MCH: 28.9 pg (ref 26.0–34.0)
MCHC: 34.2 g/dL (ref 30.0–36.0)
MCV: 84.6 fL (ref 80.0–100.0)
Monocytes Absolute: 0.6 10*3/uL (ref 0.1–1.0)
Monocytes Relative: 5 %
Neutro Abs: 10.6 10*3/uL — ABNORMAL HIGH (ref 1.7–7.7)
Neutrophils Relative %: 80 %
Platelets: 326 10*3/uL (ref 150–400)
RBC: 5.64 MIL/uL — ABNORMAL HIGH (ref 3.87–5.11)
RDW: 14.3 % (ref 11.5–15.5)
WBC: 13.1 10*3/uL — ABNORMAL HIGH (ref 4.0–10.5)
nRBC: 0 % (ref 0.0–0.2)

## 2019-11-15 LAB — COMPREHENSIVE METABOLIC PANEL
ALT: 25 U/L (ref 0–44)
AST: 30 U/L (ref 15–41)
Albumin: 3.9 g/dL (ref 3.5–5.0)
Alkaline Phosphatase: 69 U/L (ref 38–126)
Anion gap: 12 (ref 5–15)
BUN: 16 mg/dL (ref 6–20)
CO2: 24 mmol/L (ref 22–32)
Calcium: 9.2 mg/dL (ref 8.9–10.3)
Chloride: 103 mmol/L (ref 98–111)
Creatinine, Ser: 1.29 mg/dL — ABNORMAL HIGH (ref 0.44–1.00)
GFR, Estimated: 54 mL/min — ABNORMAL LOW (ref >60)
Glucose, Bld: 112 mg/dL — ABNORMAL HIGH (ref 70–99)
Potassium: 3.8 mmol/L (ref 3.5–5.1)
Sodium: 139 mmol/L (ref 135–145)
Total Bilirubin: 0.8 mg/dL (ref 0.3–1.2)
Total Protein: 7.8 g/dL (ref 6.5–8.1)

## 2019-11-15 LAB — GLUCOSE, CAPILLARY: Glucose-Capillary: 106 mg/dL — ABNORMAL HIGH (ref 70–99)

## 2019-11-15 LAB — RESPIRATORY PANEL BY RT PCR (FLU A&B, COVID)
Influenza A by PCR: NEGATIVE
Influenza B by PCR: NEGATIVE
SARS Coronavirus 2 by RT PCR: NEGATIVE

## 2019-11-15 LAB — ETHANOL: Alcohol, Ethyl (B): <10 mg/dL (ref <10)

## 2019-11-15 NOTE — ED Notes (Signed)
Pt changed out in triage. Pt belongings secured: 1 red inhaler 1 key lanyard  1 pair black slip on sandles 1 grey, blue, white hat 1 pink "bandana" 1 red jacket 1 grey necklace 1 grey ring 1 blue shirt 1 black underwear 1 black pair of shorts

## 2019-11-15 NOTE — ED Triage Notes (Signed)
Caitlin Schneider Police brought pt in with IVC paperwork, due to the fact pt was threatening to harm herself and others. Police state pt has a history of mental condition.  Pt states coming in for anxiety and depression, pt states it has not been this bad since she was 13. Pt states she cut salt out a week ago Pt currently denies SI/HI to this RN

## 2019-11-15 NOTE — ED Provider Notes (Signed)
[facility] Emergency Department Provider Note   ____________________________________________   None    (approximate)  I have reviewed the triage vital signs and the nursing notes.   HISTORY  Chief Complaint Psychiatric Evaluation    HPI Caitlin Schneider is a 39 y.o. female brought to the ED under IVC with [place] police with a chief complaint of depression with suicidal ideation.  Patient reports history of anxiety and depression and states this is the worst she has felt since she was 39 years old. History of polysubstance abuse. Voices no medical complaints other than hunger.     Past Medical History:  Diagnosis Date  . Anxiety   . Asthma   . Depression   . IV drug user   . Substance abuse [facility])     Patient Active Problem List   Diagnosis Date Noted  . Left arm cellulitis   . Abscess   . Sepsis (HCC) 05/30/2016  . Cellulitis 05/30/2016  . IV drug user 05/30/2016    Past Surgical History:  Procedure Laterality Date  . CESAREAN SECTION    . INCISION AND DRAINAGE ABSCESS Left 05/31/2016   Procedure: INCISION AND DRAINAGE ABSCESS LEFT ARM;  Surgeon: Ricarda Frame, MD;  Location: [facility] ORS;  Service: General;  Laterality: Left;    Prior to Admission medications   Not on File    Allergies Patient has no known allergies.  Family History  Problem Relation Age of Onset  . COPD Mother   . Diabetes Father   . Kidney disease Father   . Gout Brother   . Pancreatic cancer Maternal Uncle   . Diabetes Paternal Grandmother   . Lung cancer Maternal Uncle     Social History Social History   Tobacco Use  . Smoking status: Current Every Day Smoker    Packs/day: 0.50    Types: Cigarettes  . Smokeless tobacco: Never Used  Vaping Use  . Vaping Use: Never used  Substance Use Topics  . Alcohol use: No  . Drug use: Yes    Types: IV, Cocaine, Methamphetamines    Comment: Last Use Meth- 05/22/16 and Last Use Cocaine- 05/05/16 (Per Patient)     Review of Systems  Constitutional: No fever/chills Eyes: No visual changes. ENT: No sore throat. Cardiovascular: Denies chest pain. Respiratory: Denies shortness of breath. Gastrointestinal: No abdominal pain.  No nausea, no vomiting.  No diarrhea.  No constipation. Genitourinary: Negative for dysuria. Musculoskeletal: Negative for back pain. Skin: Negative for rash. Neurological: Negative for headaches, focal weakness or numbness. Psychiatric: Positive for depression with SI.   ____________________________________________   PHYSICAL EXAM:  VITAL SIGNS: ED Triage Vitals [11/15/19 2146]  Enc Vitals Group     BP 114/73     Pulse Rate (!) 119     Resp 20     Temp 98.5 F (36.9 C)     Temp Source Oral     SpO2 95 %     Weight      Height      Head Circumference      Peak Flow      Pain Score 10     Pain Loc      Pain Edu?      Excl. in GC?     Constitutional: Alert and oriented. Well appearing and in no acute distress. Eyes: Conjunctivae are normal. PERRL. EOMI. Head: Atraumatic. Nose: No congestion/rhinnorhea. Mouth/Throat: Mucous membranes are moist.   Neck: No stridor.   Cardiovascular: Normal rate, regular  rhythm. Grossly normal heart sounds.  Good peripheral circulation. Respiratory: Normal respiratory effort.  No retractions. Lungs CTAB. Gastrointestinal: Soft and nontender. No distention. No abdominal bruits. No CVA tenderness. Musculoskeletal: No lower extremity tenderness nor edema.  No joint effusions. Neurologic:  Normal speech and language. No gross focal neurologic deficits are appreciated. No gait instability. Skin:  Skin is warm, dry and intact. No rash noted. Psychiatric: Mood and affect are flat. Speech and behavior are normal.  ____________________________________________   LABS (all labs ordered are listed, but only abnormal results are displayed)  Labs Reviewed  COMPREHENSIVE METABOLIC PANEL - Abnormal; Notable for the following  components:      Result Value   Glucose, Bld 112 (*)    Creatinine, Ser 1.29 (*)    GFR, Estimated 54 (*)    All other components within normal limits  URINE DRUG SCREEN, QUALITATIVE ([facility] ONLY) - Abnormal; Notable for the following components:   Amphetamines, Ur Screen POSITIVE (*)    Cannabinoid 50 Ng, Ur [place] POSITIVE (*)    Benzodiazepine, Ur Scrn POSITIVE (*)    All other components within normal limits  CBC WITH DIFFERENTIAL/PLATELET - Abnormal; Notable for the following components:   WBC 13.1 (*)    RBC 5.64 (*)    Hemoglobin 16.3 (*)    HCT 47.7 (*)    Neutro Abs 10.6 (*)    All other components within normal limits  GLUCOSE, CAPILLARY - Abnormal; Notable for the following components:   Glucose-Capillary 106 (*)    All other components within normal limits  ACETAMINOPHEN LEVEL - Abnormal; Notable for the following components:   Acetaminophen (Tylenol), Serum <10 (*)    All other components within normal limits  SALICYLATE LEVEL - Abnormal; Notable for the following components:   Salicylate Lvl <7.0 (*)    All other components within normal limits  RESPIRATORY PANEL BY RT PCR (FLU A&B, COVID)  RESPIRATORY PANEL BY RT PCR (FLU A&B, COVID)  ETHANOL  POC URINE PREG, ED  POCT PREGNANCY, URINE   ____________________________________________  EKG  None ____________________________________________  RADIOLOGY I, Crystale Giannattasio J, personally viewed and evaluated these images (plain radiographs) as part of my medical decision making, as well as reviewing the written report by the radiologist.  ED MD interpretation: None  Official radiology report(s): No results found.  ____________________________________________   PROCEDURES  Procedure(s) performed (including Critical Care):  Procedures   ____________________________________________   INITIAL IMPRESSION / ASSESSMENT AND PLAN / ED COURSE  As part of my medical decision making, I reviewed the following data within  the electronic MEDICAL RECORD NUMBER Nursing notes reviewed and incorporated, Labs reviewed, Old chart reviewed (04/28/2017 ED visit for overdose), A consult was requested and obtained from this/these consultant(s) Psychiatry and Notes from prior ED visits     39 year old female brought to the ED under IVC for suicidal thoughts. The patient has been placed in psychiatric observation due to the need to provide a safe environment for the patient while obtaining psychiatric consultation and evaluation, as well as ongoing medical and medication management to treat the patient's condition.  The patient has been placed under full IVC at this time.       ____________________________________________   FINAL CLINICAL IMPRESSION(S) / ED DIAGNOSES  Final diagnoses:  Polysubstance abuse (HCC)  Thoughts of self harm     ED Discharge Orders    None      *Please note:  Margart Zemanek was evaluated in Emergency Department on 11/16/2019 for the symptoms  described in the history of present illness. She was evaluated in the context of the global COVID-19 pandemic, which necessitated consideration that the patient might be at risk for infection with the SARS-CoV-2 virus that causes COVID-19. Institutional protocols and algorithms that pertain to the evaluation of patients at risk for COVID-19 are in a state of rapid change based on information released by regulatory bodies including the CDC and federal and state organizations. These policies and algorithms were followed during the patient's care in the ED.  Some ED evaluations and interventions may be delayed as a result of limited staffing during and the pandemic.*   Note:  This document was prepared using Dragon voice recognition software and may include unintentional dictation errors.   Irean Hong, MD 11/16/19 [phone]

## 2019-11-15 NOTE — ED Notes (Signed)
EKG given to provider for review

## 2019-11-16 DIAGNOSIS — F1594 Other stimulant use, unspecified with stimulant-induced mood disorder: Secondary | ICD-10-CM

## 2019-11-16 LAB — SALICYLATE LEVEL: Salicylate Lvl: <7 mg/dL — ABNORMAL LOW (ref 7.0–30.0)

## 2019-11-16 LAB — URINE DRUG SCREEN, QUALITATIVE (ARMC ONLY)
Amphetamines, Ur Screen: POSITIVE — AB
Barbiturates, Ur Screen: NOT DETECTED
Benzodiazepine, Ur Scrn: POSITIVE — AB
Cannabinoid 50 Ng, Ur ~~LOC~~: POSITIVE — AB
Cocaine Metabolite,Ur ~~LOC~~: NOT DETECTED
MDMA (Ecstasy)Ur Screen: NOT DETECTED
Methadone Scn, Ur: NOT DETECTED
Opiate, Ur Screen: NOT DETECTED
Phencyclidine (PCP) Ur S: NOT DETECTED
Tricyclic, Ur Screen: NOT DETECTED

## 2019-11-16 LAB — POCT PREGNANCY, URINE: Preg Test, Ur: NEGATIVE

## 2019-11-16 LAB — ACETAMINOPHEN LEVEL: Acetaminophen (Tylenol), Serum: <10 ug/mL — ABNORMAL LOW (ref 10–30)

## 2019-11-16 NOTE — Discharge Instructions (Addendum)
[facility]  [address]  [address]  [phone]

## 2019-11-16 NOTE — ED Provider Notes (Signed)
-----------------------------------------   1:00 PM on 11/16/2019 -----------------------------------------  Patient has been seen and evaluated by psychiatry.  They believe the patient safe for discharge home from psychiatric standpoint.  Patient's medical work-up is been largely nonrevealing besides a urine toxicology positive for amphetamines cannabinoids and benzodiazepines.  Patient will be discharged with outpatient resources.   Minna Antis, MD 11/16/19 1300

## 2019-11-16 NOTE — BH Assessment (Addendum)
Assessment Note  Caitlin Schneider is an 39 y.o. caucasian  female who presents to the ED via IVC. Per the initial triage note, "[place] [employer] brought pt in with IVC paperwork, due to the fact pt was threatening to harm herself and others. Police state pt has a history of mental condition.  Pt states coming in for anxiety and depression, pt states it has not been this bad since she was 13. Pt states she cut salt out a week ago but currently denies SI/HI to this RN".  Writer was able to assess patient in which patient presented with anxiety and some tearfulness. Patient reports she had a panic attack that was prompted by her phone breaking. Patient reports that she came out of the bathroom to find that her phone wouldn't turn on. Patient reports upon finding out her phone wouldn't turn on she became irate and reportedly told her mother that she wanted to kill her and herself. Patient immediately reported to writer that she would never kill herself or her mother, she was just upset that her phone wouldn't turn back on. Patient reports she is not currently seeing a psychiatrist or therapist to address her mental health concerns. Patient denies the use of alcohol/ drugs, SI/HI/AH/VH. Patient reports she has a court date coming up due to destroying someone else's personal property. Client reports her sleep habits have decreased but appetite remains normal.   Writer has called patients mother for collateral but received no response.   This case is currently being staffed with Psych. Disposition to follow shortly.     Diagnosis: Depression, Anxiety  Past Medical History:  Past Medical History:  Diagnosis Date  . Anxiety   . Asthma   . Depression   . IV drug user   . Substance abuse [facility])     Past Surgical History:  Procedure Laterality Date  . CESAREAN SECTION    . INCISION AND DRAINAGE ABSCESS Left 05/31/2016   Procedure: INCISION AND DRAINAGE ABSCESS LEFT ARM;  Surgeon: Ricarda Frame, MD;   Location: [facility] ORS;  Service: General;  Laterality: Left;    Family History:  Family History  Problem Relation Age of Onset  . COPD Mother   . Diabetes Father   . Kidney disease Father   . Gout Brother   . Pancreatic cancer Maternal Uncle   . Diabetes Paternal Grandmother   . Lung cancer Maternal Uncle     Social History:  reports that she has been smoking cigarettes. She has been smoking about 0.50 packs per day. She has never used smokeless tobacco. She reports current drug use. Drugs: IV, Cocaine, and Methamphetamines. She reports that she does not drink alcohol.  Additional Social History:  Alcohol / Drug Use Pain Medications: See PTA Prescriptions: See PTA Over the Counter: See PTA History of alcohol / drug use?: No history of alcohol / drug abuse  CIWA: CIWA-Ar BP: 114/73 Pulse Rate: (!) 119 COWS:    Allergies: No Known Allergies  Home Medications: (Not in a hospital admission)   OB/GYN Status:  No LMP recorded.  General Assessment Data Location of Assessment: [facility] ED TTS Assessment: In system Is this a Tele or Face-to-Face Assessment?: Face-to-Face Is this an Initial Assessment or a Re-assessment for this encounter?: Initial Assessment Patient Accompanied by:: N/A Language Other than English: No Living Arrangements:  (private home) Admission Status: Involuntary  Medical Screening Exam [facility] Walk-in ONLY) Medical Exam completed: No        Risk to self with the  past 6 months Suicidal Ideation: No Has patient been a risk to self within the past 6 months prior to admission? : No Suicidal Intent: No Has patient had any suicidal intent within the past 6 months prior to admission? : No Is patient at risk for suicide?: No Suicidal Plan?: No Has patient had any suicidal plan within the past 6 months prior to admission? : No Access to Means: No Intentional Self Injurious Behavior: None Family Suicide History: Unknown Recent stressful life event(s): Legal  Issues, Financial Problems Persecutory voices/beliefs?: No Depression: Yes Depression Symptoms: Tearfulness, Feeling angry/irritable, Feeling worthless/self pity, Loss of interest in usual pleasures Substance abuse history and/or treatment for substance abuse?: No Suicide prevention information given to non-admitted patients: Not applicable  Risk to Others within the past 6 months Homicidal Ideation: No  Psychosis Hallucinations: None noted Delusions: None noted  Mental Status Report Appearance/Hygiene: In scrubs Eye Contact: Poor Motor Activity: Agitation, Freedom of movement, Restlessness, Unsteady Speech: Logical/coherent, Soft Level of Consciousness: Quiet/awake Mood: Anxious, Depressed Affect: Anxious, Depressed Anxiety Level: Minimal Thought Processes: Coherent, Relevant Judgement: Unable to Assess Orientation: Appropriate for developmental age, Situation, Time, Place, Person Obsessive Compulsive Thoughts/Behaviors: Unable to Assess  Cognitive Functioning Insight: Fair Sleep: Decreased Total Hours of Sleep:  (1-2)  ADLScreening [facility] Assessment Services) Patient's cognitive ability adequate to safely complete daily activities?: Yes Patient able to express need for assistance with ADLs?: Yes Independently performs ADLs?: Yes (appropriate for developmental age)        ADL Screening (condition at time of admission) Patient's cognitive ability adequate to safely complete daily activities?: Yes Is the patient deaf or have difficulty hearing?: No Does the patient have difficulty seeing, even when wearing glasses/contacts?: No Does the patient have difficulty concentrating, remembering, or making decisions?: No Patient able to express need for assistance with ADLs?: Yes Does the patient have difficulty dressing or bathing?: No Independently performs ADLs?: Yes (appropriate for developmental age) Does the patient have difficulty walking or climbing stairs?: No Weakness  of Legs: None Weakness of Arms/Hands: None  Home Assistive Devices/Equipment Home Assistive Devices/Equipment: None  Therapy Consults (therapy consults require a physician order) PT Evaluation Needed: No OT Evalulation Needed: No SLP Evaluation Needed: No Abuse/Neglect Assessment (Assessment to be complete while patient is alone) Abuse/Neglect Assessment Can Be Completed: Yes Physical Abuse: Denies Verbal Abuse: Denies Sexual Abuse: Denies Exploitation of patient/patient's resources: Denies Self-Neglect: Denies Values / Beliefs Cultural Requests During Hospitalization: None Spiritual Requests During Hospitalization: None Consults Spiritual Care Consult Needed: No Transition of Care Team Consult Needed: No Advance Directives (For Healthcare) Does Patient Have a Medical Advance Directive?: No        Disposition:  Disposition Initial Assessment Completed for this Encounter: Yes Patient referred to:  (to be assessed by psych)  On Site Evaluation by:   Reviewed with Physician:    Willene Hatchet, MS.,LCMHC,NCC 11/16/2019 3:09 AM

## 2019-11-16 NOTE — BH Assessment (Signed)
Writer spoke with the patient to complete an updated/reassessment. Patient denies SI/HI and AV/H. Writer and Psych NP Catha Nottingham L.), spoke with patient's fiance via phone call and he shared he had no concern for her safety or her hurting anyone else.

## 2019-11-16 NOTE — Consult Note (Signed)
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  She reports that she got upset last night when her phone broke and threatened to hurt herself.  She was also under the influence of meth amphetamine.  Today she is clear and coherent with no suicidal/homicidal ideations, hallucinations, withdrawal symptoms, or mania.  Permission obtained to speak with her fianc, Caitlin Schneider, who has no safety concerns and is agreeable for her to return home.  Psychiatrically cleared for discharge.  Total Time spent with patient: 45 minutes  Past Psychiatric History: substance abuse  Past Medical History:  Past Medical History:  Diagnosis Date  . Anxiety   . Asthma   . Depression   . IV drug user   . Substance abuse [facility])     Past Surgical History:  Procedure Laterality Date  . CESAREAN SECTION    . INCISION AND DRAINAGE ABSCESS Left 05/31/2016   Procedure: INCISION AND DRAINAGE ABSCESS LEFT ARM;  Surgeon: Ricarda Frame, MD;  Location: [facility] ORS;  Service: General;  Laterality: Left;   Family History:  Family History  Problem Relation Age of Onset  . COPD Mother   . Diabetes Father   . Kidney disease Father   . Gout Brother   . Pancreatic cancer Maternal Uncle   . Diabetes Paternal Grandmother   . Lung cancer Maternal Uncle    Family Psychiatric  History: none Social History:  Social History   Substance and Sexual Activity  Alcohol Use No     Social History   Substance and Sexual Activity  Drug Use Yes  . Types: IV, Cocaine, Methamphetamines   Comment: Last Use Meth- 05/22/16 and Last Use Cocaine- 05/05/16 (Per Patient)    Social History   Socioeconomic History  . Marital status: Single    Spouse name: Not on  file  . Number of children: Not on file  . Years of education: Not on file  . Highest education level: Not on file  Occupational History  . Not on file  Tobacco Use  . Smoking status: Current Every Day Smoker    Packs/day: 0.50    Types: Cigarettes  . Smokeless tobacco: Never Used  Vaping Use  . Vaping Use: Never used  Substance and Sexual Activity  . Alcohol use: No  . Drug use: Yes    Types: IV, Cocaine, Methamphetamines    Comment: Last Use Meth- 05/22/16 and Last Use Cocaine- 05/05/16 (Per Patient)  . Sexual activity: Yes    Birth control/protection: None  Other Topics Concern  . Not on file  Social History Narrative  . Not on file   Social Determinants of Health   Financial Resource Strain:   . Difficulty of Paying Living Expenses: Not on file  Food Insecurity:   . Worried About Programme researcher, broadcasting/film/video in the Last Year: Not on file  . Ran Out of Food in the Last Year: Not on file  Transportation Needs:   . Lack of Transportation (Medical): Not on file  . Lack of Transportation (Non-Medical): Not on file  Physical Activity:   . Days of Exercise per Week: Not on file  . Minutes of Exercise per Session: Not on file  Stress:   . Feeling of Stress : Not on file  Social  Connections:   . Frequency of Communication with Friends and Family: Not on file  . Frequency of Social Gatherings with Friends and Family: Not on file  . Attends Religious Services: Not on file  . Active Member of Clubs or Organizations: Not on file  . Attends Banker Meetings: Not on file  . Marital Status: Not on file    Has this patient used any form of tobacco in the last 30 days? (Cigarettes, Smokeless Tobacco, Cigars, and/or Pipes) A prescription for an FDA-approved tobacco cessation medication was offered at discharge and the patient refused  Current Medications: No current facility-administered medications for this encounter.   No current outpatient medications on file.   PTA  Medications: (Not in a hospital admission)   Musculoskeletal: Strength & Muscle Tone: within normal limits Gait & Station: normal Patient leans: N/A  Psychiatric Specialty Exam: Physical Exam Vitals and nursing note reviewed.  Constitutional:      Appearance: Normal appearance.  HENT:     Head: Normocephalic.     Nose: Nose normal.  Pulmonary:     Effort: Pulmonary effort is normal.  Musculoskeletal:        General: Normal range of motion.     Cervical back: Normal range of motion.  Neurological:     General: No focal deficit present.     Mental Status: She is alert and oriented to person, place, and time.  Psychiatric:        Attention and Perception: Attention and perception normal.        Mood and Affect: Mood is anxious.        Speech: Speech normal.        Behavior: Behavior normal. Behavior is cooperative.        Thought Content: Thought content normal.        Cognition and Memory: Cognition and memory normal.        Judgment: Judgment is impulsive.     Review of Systems  Psychiatric/Behavioral: The patient is nervous/anxious.   All other systems reviewed and are negative.   Blood pressure 118/83, pulse 88, temperature 98.5 F (36.9 C), temperature source Oral, resp. rate 18, SpO2 95 %, unknown if currently breastfeeding.There is no height or weight on file to calculate BMI.  General Appearance: Casual  Eye Contact:  Good  Speech:  Normal Rate  Volume:  Normal  Mood:  Anxious  Affect:  Congruent  Thought Process:  Coherent and Descriptions of Associations: Intact  Orientation:  Full (Time, Place, and Person)  Thought Content:  WDL and Logical  Suicidal Thoughts:  No  Homicidal Thoughts:  No  Memory:  Immediate;   Good Recent;   Good Remote;   Good  Judgement:  Fair  Insight:  Fair  Psychomotor Activity:  Normal  Concentration:  Concentration: Good and Attention Span: Good  Recall:  Good  Fund of Knowledge:  Good  Language:  Good  Akathisia:  No   Handed:  Right  AIMS (if indicated):     Assets:  Housing Leisure Time Physical Health Resilience Social Support  ADL's:  Intact  Cognition:  WNL  Sleep:        Demographic Factors:  Caucasian  Loss Factors: NA  Historical Factors: Impulsivity  Risk Reduction Factors:   Responsible for children under 64 years of age, Sense of responsibility to family, Living with another person, especially a relative and Positive social support  Continued Clinical Symptoms:  Anxiety, mild  Cognitive Features That Contribute  To Risk:  None    Suicide Risk:  Minimal: No identifiable suicidal ideation.  Patients presenting with no risk factors but with morbid ruminations; may be classified as minimal risk based on the severity of the depressive symptoms   Plan Of Care/Follow-up recommendations:  Methamphetamine induced mood disorder: -Refrain from alcohol and drug abuse -Attend 12 step program with a sponsor -ADS Activity:  as tolerated Diet:  heart healthy diet  Disposition: discharge home Nanine Means, NP 11/16/2019, 12:09 PM

## 2019-11-16 NOTE — ED Notes (Signed)
ED BHU PLACEMENT JUSTIFICATION Is the patient under IVC or is there intent for IVC: Yes.   Is the patient medically cleared: Yes.   Is there vacancy in the ED BHU: Yes.   Is the population mix appropriate for patient: Yes.   Is the patient awaiting placement in inpatient or outpatient setting: Yes.   Has the patient had a psychiatric consult: No. Survey of unit performed for contraband, proper placement and condition of furniture, tampering with fixtures in bathroom, shower, and each patient room: Yes.  ; Findings: unremarkable APPEARANCE/BEHAVIOR calm NEURO ASSESSMENT Orientation: time, place and person Hallucinations: No.None noted (Hallucinations) Speech: Normal Gait: unsteady RESPIRATORY ASSESSMENT unremarkable CARDIOVASCULAR ASSESSMENT unremarkable GASTROINTESTINAL ASSESSMENT soft, nontender, BS WNL, no r/g EXTREMITIES normal strength, tone, and muscle mass, gait abnormal - left hip pain and stiffness, worse with initial movement - pt reports 8/10 pain, denies taking meds and denies desire for pain meds PLAN OF CARE Provide calm/safe environment. Vital signs assessed twice daily. ED BHU Assessment once each 12-hour shift. Collaborate with intake RN daily or as condition indicates. Assure the ED provider has rounded once each shift. Provide and encourage hygiene. Provide redirection as needed. Assess for escalating behavior; address immediately and inform ED provider.  Assess family dynamic and appropriateness for visitation as needed: Yes.  ; If necessary, describe findings: hold until cleared from substance use Educate the patient/family about BHU procedures/visitation: Yes.  ; If necessary, describe findings: unremarkable  SEE PSYCH ASSESSMENT  Patient assigned to appropriate care area. Patient oriented to unit/care area: Informed that, for their safety, care areas are designed for safety and monitored by security cameras at all times; and visiting hours explained to patient.  Patient verbalizes understanding, and verbal contract for safety obtained.

## 2020-02-05 ENCOUNTER — Other Ambulatory Visit: Payer: Medicaid Other

## 2020-02-05 DIAGNOSIS — Z20822 Contact with and (suspected) exposure to covid-19: Secondary | ICD-10-CM | POA: Diagnosis not present

## 2020-02-07 LAB — NOVEL CORONAVIRUS, NAA: SARS-CoV-2, NAA: NOT DETECTED

## 2020-02-07 LAB — SARS-COV-2, NAA 2 DAY TAT

## 2020-02-18 ENCOUNTER — Encounter: Payer: Self-pay | Admitting: Emergency Medicine

## 2020-02-18 ENCOUNTER — Emergency Department
Admission: EM | Admit: 2020-02-18 | Discharge: 2020-02-18 | Disposition: A | Discharge status: Home / Self Care | Payer: Medicaid Other | Attending: Emergency Medicine | Admitting: Emergency Medicine

## 2020-02-18 ENCOUNTER — Other Ambulatory Visit: Payer: Self-pay

## 2020-02-18 DIAGNOSIS — Z3202 Encounter for pregnancy test, result negative: Secondary | ICD-10-CM | POA: Insufficient documentation

## 2020-02-18 DIAGNOSIS — R350 Frequency of micturition: Secondary | ICD-10-CM

## 2020-02-18 DIAGNOSIS — J45909 Unspecified asthma, uncomplicated: Secondary | ICD-10-CM | POA: Insufficient documentation

## 2020-02-18 DIAGNOSIS — B9689 Other specified bacterial agents as the cause of diseases classified elsewhere: Secondary | ICD-10-CM

## 2020-02-18 DIAGNOSIS — N760 Acute vaginitis: Secondary | ICD-10-CM | POA: Insufficient documentation

## 2020-02-18 DIAGNOSIS — F17210 Nicotine dependence, cigarettes, uncomplicated: Secondary | ICD-10-CM | POA: Insufficient documentation

## 2020-02-18 LAB — COMPREHENSIVE METABOLIC PANEL
ALT: 52 U/L — ABNORMAL HIGH (ref 0–44)
AST: 29 U/L (ref 15–41)
Albumin: 4 g/dL (ref 3.5–5.0)
Alkaline Phosphatase: 65 U/L (ref 38–126)
Anion gap: 13 (ref 5–15)
BUN: 17 mg/dL (ref 6–20)
CO2: 23 mmol/L (ref 22–32)
Calcium: 9.3 mg/dL (ref 8.9–10.3)
Chloride: 101 mmol/L (ref 98–111)
Creatinine, Ser: 0.9 mg/dL (ref 0.44–1.00)
GFR, Estimated: >60 mL/min (ref >60)
Glucose, Bld: 83 mg/dL (ref 70–99)
Potassium: 4 mmol/L (ref 3.5–5.1)
Sodium: 137 mmol/L (ref 135–145)
Total Bilirubin: 0.5 mg/dL (ref 0.3–1.2)
Total Protein: 7.6 g/dL (ref 6.5–8.1)

## 2020-02-18 LAB — URINALYSIS, COMPLETE (UACMP) WITH MICROSCOPIC
Bacteria, UA: NONE SEEN
Bilirubin Urine: NEGATIVE
Glucose, UA: NEGATIVE mg/dL
Hgb urine dipstick: NEGATIVE
Ketones, ur: NEGATIVE mg/dL
Leukocytes,Ua: NEGATIVE
Nitrite: NEGATIVE
Protein, ur: NEGATIVE mg/dL
Specific Gravity, Urine: 1.017 (ref 1.005–1.030)
pH: 6 (ref 5.0–8.0)

## 2020-02-18 LAB — CBC WITH DIFFERENTIAL/PLATELET
Abs Immature Granulocytes: 0.06 10*3/uL (ref 0.00–0.07)
Basophils Absolute: 0.1 10*3/uL (ref 0.0–0.1)
Basophils Relative: 1 %
Eosinophils Absolute: 0.2 10*3/uL (ref 0.0–0.5)
Eosinophils Relative: 2 %
HCT: 46.5 % — ABNORMAL HIGH (ref 36.0–46.0)
Hemoglobin: 15.9 g/dL — ABNORMAL HIGH (ref 12.0–15.0)
Immature Granulocytes: 1 %
Lymphocytes Relative: 33 %
Lymphs Abs: 2.7 10*3/uL (ref 0.7–4.0)
MCH: 29.3 pg (ref 26.0–34.0)
MCHC: 34.2 g/dL (ref 30.0–36.0)
MCV: 85.6 fL (ref 80.0–100.0)
Monocytes Absolute: 0.4 10*3/uL (ref 0.1–1.0)
Monocytes Relative: 5 %
Neutro Abs: 4.9 10*3/uL (ref 1.7–7.7)
Neutrophils Relative %: 58 %
Platelets: 272 10*3/uL (ref 150–400)
RBC: 5.43 MIL/uL — ABNORMAL HIGH (ref 3.87–5.11)
RDW: 14.1 % (ref 11.5–15.5)
WBC: 8.3 10*3/uL (ref 4.0–10.5)
nRBC: 0 % (ref 0.0–0.2)

## 2020-02-18 LAB — WET PREP, GENITAL
Sperm: NONE SEEN
Trich, Wet Prep: NONE SEEN
Yeast Wet Prep HPF POC: NONE SEEN

## 2020-02-18 LAB — HCG, QUANTITATIVE, PREGNANCY: hCG, Beta Chain, Quant, S: 2 m[IU]/mL (ref <5)

## 2020-02-18 LAB — CHLAMYDIA/NGC RT PCR (ARMC ONLY)
Chlamydia Tr: NOT DETECTED
N gonorrhoeae: NOT DETECTED

## 2020-02-18 LAB — POC URINE PREG, ED: Preg Test, Ur: NEGATIVE

## 2020-02-18 MED ORDER — METRONIDAZOLE 500 MG PO TABS: 500.0000 mg | ORAL_TABLET | Freq: Two times a day (BID) | ORAL | 0 refills | Status: DC

## 2020-02-18 NOTE — Discharge Instructions (Addendum)
Follow-up with OB/GYN if any continued problems.  Begin taking the Flagyl 500 mg twice daily for 7 days.  Do not drink alcohol or take over-the-counter medications containing alcohol while taking this medication.  You may take Tylenol if needed for discomfort.

## 2020-02-18 NOTE — ED Triage Notes (Signed)
Pt comes into the ED via POV c/o possible pregnancy and increased urination.  Denies any burning with urination.  Pt states she has not had a menstrual cycle for 3 months.  Pt in NAD and denies any other complaints.

## 2020-02-18 NOTE — ED Notes (Signed)
Patient ambulated to bathroom.

## 2020-02-18 NOTE — ED Provider Notes (Signed)
[facility] Emergency Department Provider Note ____________________________________________   Event Date/Time   First MD Initiated Contact with Patient 02/18/20 1158     (approximate)  I have reviewed the triage vital signs and the nursing notes.   HISTORY  Chief Complaint Possible Pregnancy HPI Caitlin Schneider is a 40 y.o. female presents for possible pregnancy and increased urination.  No dysuria. No vaginal discharge.  No menses for 3 months.  Denies fever, chills, nausea, vomiting or flank pain.  Patient reports that she has not had a Pap smear in approximately 3 to 4 years.  She denies any pain at this time.  Patient reports that she has had 2 home pregnancy tests that were positive.      Past Medical History:  Diagnosis Date  . Anxiety   . Asthma   . Depression   . IV drug user   . Substance abuse [facility])     Patient Active Problem List   Diagnosis Date Noted  . 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 Left arm cellulitis   . Abscess   . Sepsis (HCC) 05/30/2016  . Cellulitis 05/30/2016  . IV drug user 05/30/2016    Past Surgical History:  Procedure Laterality Date  . CESAREAN SECTION    . INCISION AND DRAINAGE ABSCESS Left 05/31/2016   Procedure: INCISION AND DRAINAGE ABSCESS LEFT ARM;  Surgeon: Ricarda Frame, MD;  Location: [facility] ORS;  Service: General;  Laterality: Left;    Prior to Admission medications   Medication Sig Start Date End Date Taking? Authorizing Provider  metroNIDAZOLE (FLAGYL) 500 MG tablet Take 1 tablet (500 mg total) by mouth 2 (two) times daily. 02/18/20  Yes Tommi Rumps, PA-C    Allergies Patient has no known allergies.  Family History  Problem Relation Age of Onset  . COPD Mother   . Diabetes Father   . Kidney disease Father   . Gout Brother   . Pancreatic cancer Maternal Uncle   . Diabetes Paternal Grandmother   . Lung cancer Maternal Uncle     Social History Social History    Tobacco Use  . Smoking status: Current Every Day Smoker    Packs/day: 0.50    Types: Cigarettes  . Smokeless tobacco: Never Used  Vaping Use  . Vaping Use: Never used  Substance Use Topics  . Alcohol use: No  . Drug use: Yes    Types: IV, Cocaine, Methamphetamines    Comment: Last Use Meth- 05/22/16 and Last Use Cocaine- 05/05/16 (Per Patient)    Review of Systems Constitutional: No fever/chills Eyes: No visual changes. Cardiovascular: Denies chest pain. Respiratory: Denies shortness of breath. Gastrointestinal: No abdominal pain.  No nausea, no vomiting.  Genitourinary: Positive frequent urination.  Negative for vaginal discharge. Musculoskeletal: Negative for back pain. Skin: Negative for rash. Neurological: Negative for headaches, focal weakness or numbness. ____________________________________________   PHYSICAL EXAM:  VITAL SIGNS: ED Triage Vitals  Enc Vitals Group     BP 02/18/20 1153 130/74     Pulse Rate 02/18/20 1153 98     Resp 02/18/20 1153 18     Temp 02/18/20 1153 98.1 F (36.7 C)     Temp Source 02/18/20 1153 Oral     SpO2 02/18/20 1153 100 %     Weight 02/18/20 1138 195 lb (88.5 kg)     Height 02/18/20 1138 5\' 2"  (1.575 m)     Head Circumference --      Peak Flow --  Pain Score 02/18/20 1136 0     Pain Loc --      Pain Edu? --      Excl. in GC? --     Constitutional: Alert and oriented. Well appearing and in no acute distress. Eyes: Conjunctivae are normal.  Head: Atraumatic. Neck: No stridor.   Cardiovascular: Normal rate, regular rhythm. Grossly normal heart sounds.  Good peripheral circulation. Respiratory: Normal respiratory effort.  No retractions. Lungs CTAB. Gastrointestinal: Soft and nontender. No distention.  No CVA tenderness. Genitourinary: External genitalia is unremarkable.  No cervical motion tenderness on bimanual exam, no adnexal masses or tenderness is noted. Musculoskeletal: Moves upper and lower extremities without  difficulty.  Normal gait was noted. Neurologic:  Normal speech and language. No gross focal neurologic deficits are appreciated. No gait instability. Skin:  Skin is warm, dry and intact. No rash noted. Psychiatric: Mood and affect are normal. Speech and behavior are normal.  ____________________________________________   LABS (all labs ordered are listed, but only abnormal results are displayed)  Labs Reviewed  WET PREP, GENITAL - Abnormal; Notable for the following components:      Result Value   Clue Cells Wet Prep HPF POC PRESENT (*)    WBC, Wet Prep HPF POC FEW (*)    All other components within normal limits  URINALYSIS, COMPLETE (UACMP) WITH MICROSCOPIC - Abnormal; Notable for the following components:   Color, Urine YELLOW (*)    APPearance CLEAR (*)    All other components within normal limits  CBC WITH DIFFERENTIAL/PLATELET - Abnormal; Notable for the following components:   RBC 5.43 (*)    Hemoglobin 15.9 (*)    HCT 46.5 (*)    All other components within normal limits  COMPREHENSIVE METABOLIC PANEL - Abnormal; Notable for the following components:   ALT 52 (*)    All other components within normal limits  CHLAMYDIA/NGC RT PCR ([facility] ONLY)  HCG, QUANTITATIVE, PREGNANCY  POC URINE PREG, ED     RADIOLOGY I, Tommi Rumps, personally viewed and evaluated these images (plain radiographs) as part of my medical decision making, as well as reviewing the written report by the radiologist.  ED MD interpretation:    Official radiology report(s): No results found.  ____________________________________________   PROCEDURES  Procedure(s) performed (including Critical Care):  Procedures   ____________________________________________   INITIAL IMPRESSION / ASSESSMENT AND PLAN / ED COURSE  As part of my medical decision making, I reviewed the following data within the electronic MEDICAL RECORD NUMBER Notes from prior ED visits and [place] Controlled Substance  Database  40 year old female presents to the ED with concerns of possible pregnancy as she has not had a period for the last 3 months.  She also complains of urinary frequency.  No vaginal discharge is reported.  Patient was seen in triage extension 7.  She was made aware that she was not pregnant and that there was no urinary tract infection.  She currently is waiting for a vaginal exam.   Vaginal exam was unremarkable.  Wet prep did show positive clue cells.  Beta hCG was negative.  GC and Chlamydia test were pending at the time patient's discharge.  She is aware that a prescription for Flagyl is being sent to her pharmacy and that she would have an adverse reaction if she had alcohol with this medication.  Patient normally sees [facility] is encouraged to make an appointment if she continues to have any problems.  ____________________________________________   FINAL CLINICAL IMPRESSION(S) /  ED DIAGNOSES  Final diagnoses:  Urinary frequency  Pregnancy examination or test, negative result  Bacterial vaginosis     ED Discharge Orders         Ordered    metroNIDAZOLE (FLAGYL) 500 MG tablet  2 times daily        02/18/20 1707          *Please note:  Caitlin Schneider was evaluated in Emergency Department on 02/18/2020 for the symptoms described in the history of present illness. She was evaluated in the context of the global COVID-19 pandemic, which necessitated consideration that the patient might be at risk for infection with the SARS-CoV-2 virus that causes COVID-19. Institutional protocols and algorithms that pertain to the evaluation of patients at risk for COVID-19 are in a state of rapid change based on information released by regulatory bodies including the CDC and federal and state organizations. These policies and algorithms were followed during the patient's care in the ED.  Some ED evaluations and interventions may be delayed as a result of limited staffing during and the  pandemic.*   Note:  This document was prepared using Dragon voice recognition software and may include unintentional dictation errors.    Tommi Rumps, PA-C 02/18/20 1712    Merwyn Katos, MD 02/19/20 1739

## 2020-03-29 ENCOUNTER — Other Ambulatory Visit: Payer: Self-pay

## 2020-03-29 ENCOUNTER — Emergency Department: Payer: Medicaid Other

## 2020-03-29 ENCOUNTER — Encounter: Payer: Self-pay | Admitting: Emergency Medicine

## 2020-03-29 ENCOUNTER — Emergency Department
Admission: EM | Admit: 2020-03-29 | Discharge: 2020-03-29 | Disposition: A | Discharge status: Home / Self Care | Payer: Medicaid Other | Attending: Emergency Medicine | Admitting: Emergency Medicine

## 2020-03-29 DIAGNOSIS — J45909 Unspecified asthma, uncomplicated: Secondary | ICD-10-CM | POA: Insufficient documentation

## 2020-03-29 DIAGNOSIS — R1032 Left lower quadrant pain: Secondary | ICD-10-CM | POA: Diagnosis not present

## 2020-03-29 DIAGNOSIS — N83201 Unspecified ovarian cyst, right side: Secondary | ICD-10-CM | POA: Diagnosis not present

## 2020-03-29 DIAGNOSIS — F17210 Nicotine dependence, cigarettes, uncomplicated: Secondary | ICD-10-CM | POA: Diagnosis not present

## 2020-03-29 LAB — URINALYSIS, ROUTINE W REFLEX MICROSCOPIC
Bilirubin Urine: NEGATIVE
Glucose, UA: NEGATIVE mg/dL
Ketones, ur: NEGATIVE mg/dL
Leukocytes,Ua: NEGATIVE
Nitrite: NEGATIVE
Protein, ur: NEGATIVE mg/dL
Specific Gravity, Urine: 1.019 (ref 1.005–1.030)
pH: 5 (ref 5.0–8.0)

## 2020-03-29 LAB — CBC WITH DIFFERENTIAL/PLATELET
Abs Immature Granulocytes: 0.03 10*3/uL (ref 0.00–0.07)
Basophils Absolute: 0.1 10*3/uL (ref 0.0–0.1)
Basophils Relative: 1 %
Eosinophils Absolute: 0.2 10*3/uL (ref 0.0–0.5)
Eosinophils Relative: 2 %
HCT: 46.2 % — ABNORMAL HIGH (ref 36.0–46.0)
Hemoglobin: 15.7 g/dL — ABNORMAL HIGH (ref 12.0–15.0)
Immature Granulocytes: 0 %
Lymphocytes Relative: 34 %
Lymphs Abs: 2.5 10*3/uL (ref 0.7–4.0)
MCH: 29.4 pg (ref 26.0–34.0)
MCHC: 34 g/dL (ref 30.0–36.0)
MCV: 86.5 fL (ref 80.0–100.0)
Monocytes Absolute: 0.4 10*3/uL (ref 0.1–1.0)
Monocytes Relative: 5 %
Neutro Abs: 4.3 10*3/uL (ref 1.7–7.7)
Neutrophils Relative %: 58 %
Platelets: 282 10*3/uL (ref 150–400)
RBC: 5.34 MIL/uL — ABNORMAL HIGH (ref 3.87–5.11)
RDW: 13.8 % (ref 11.5–15.5)
WBC: 7.4 10*3/uL (ref 4.0–10.5)
nRBC: 0 % (ref 0.0–0.2)

## 2020-03-29 LAB — COMPREHENSIVE METABOLIC PANEL
ALT: 18 U/L (ref 0–44)
AST: 20 U/L (ref 15–41)
Albumin: 4 g/dL (ref 3.5–5.0)
Alkaline Phosphatase: 63 U/L (ref 38–126)
Anion gap: 9 (ref 5–15)
BUN: 17 mg/dL (ref 6–20)
CO2: 21 mmol/L — ABNORMAL LOW (ref 22–32)
Calcium: 8.7 mg/dL — ABNORMAL LOW (ref 8.9–10.3)
Chloride: 107 mmol/L (ref 98–111)
Creatinine, Ser: 0.9 mg/dL (ref 0.44–1.00)
GFR, Estimated: >60 mL/min (ref >60)
Glucose, Bld: 103 mg/dL — ABNORMAL HIGH (ref 70–99)
Potassium: 4.3 mmol/L (ref 3.5–5.1)
Sodium: 137 mmol/L (ref 135–145)
Total Bilirubin: 0.7 mg/dL (ref 0.3–1.2)
Total Protein: 7.4 g/dL (ref 6.5–8.1)

## 2020-03-29 LAB — HCG, QUANTITATIVE, PREGNANCY: hCG, Beta Chain, Quant, S: 1 m[IU]/mL (ref <5)

## 2020-03-29 LAB — POC URINE PREG, ED: Preg Test, Ur: NEGATIVE

## 2020-03-29 MED ORDER — OXYCODONE-ACETAMINOPHEN 5-325 MG PO TABS
1.0000 | ORAL_TABLET | Freq: Once | ORAL | Status: AC
  Administered 2020-03-29: 1 via ORAL
  Filled 2020-03-29: qty 1

## 2020-03-29 MED ORDER — DICYCLOMINE HCL 10 MG PO CAPS: 10.0000 mg | ORAL_CAPSULE | Freq: Four times a day (QID) | ORAL | 0 refills | Status: DC

## 2020-03-29 MED ORDER — DICYCLOMINE HCL 10 MG PO CAPS
10.0000 mg | ORAL_CAPSULE | Freq: Once | ORAL | Status: AC
  Administered 2020-03-29: 10 mg via ORAL
  Filled 2020-03-29: qty 1

## 2020-03-29 NOTE — ED Provider Notes (Signed)
[facility] Emergency Department Provider Note  ____________________________________________   Event Date/Time   First MD Initiated Contact with Patient 03/29/20 1113     (approximate)  I have reviewed the triage vital signs and the nursing notes.   HISTORY  Chief Complaint Flank Pain    HPI Caitlin Schneider is a 40 y.o. female presents emergency department with left lower quadrant pain that radiates to her back.  Patient states some pink to her urine.  No history of kidney stones.  Patient states she was here a month ago when she had had 2+ home pregnancy test but her test were negative.  States she is concerned that she almost feels like these are labor pains and movement in her belly.  No vaginal discharge.  LMP was 4 to 5 months ago.  Pain has been on and off for several weeks but now with the blood in the urine started today      Past Medical History:  Diagnosis Date  . Anxiety   . Asthma   . Depression   . IV drug user   . Substance abuse [facility])     Patient Active Problem List   Diagnosis Date Noted  . 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 Left arm cellulitis   . Abscess   . Sepsis (HCC) 05/30/2016  . Cellulitis 05/30/2016  . IV drug user 05/30/2016    Past Surgical History:  Procedure Laterality Date  . CESAREAN SECTION    . INCISION AND DRAINAGE ABSCESS Left 05/31/2016   Procedure: INCISION AND DRAINAGE ABSCESS LEFT ARM;  Surgeon: Ricarda Frame, MD;  Location: [facility] ORS;  Service: General;  Laterality: Left;    Prior to Admission medications   Medication Sig Start Date End Date Taking? Authorizing Provider  dicyclomine (BENTYL) 10 MG capsule Take 1 capsule (10 mg total) by mouth 4 (four) times daily for 14 days. 03/29/20 04/12/20 Yes Dannielle Baskins, Roselyn Bering, PA-C    Allergies Patient has no known allergies.  Family History  Problem Relation Age of Onset  . COPD Mother   . Diabetes Father   . Kidney disease Father   .  Gout Brother   . Pancreatic cancer Maternal Uncle   . Diabetes Paternal Grandmother   . Lung cancer Maternal Uncle     Social History Social History   Tobacco Use  . Smoking status: Current Every Day Smoker    Packs/day: 0.50    Types: Cigarettes  . Smokeless tobacco: Never Used  Vaping Use  . Vaping Use: Never used  Substance Use Topics  . Alcohol use: No  . Drug use: Yes    Types: IV, Cocaine, Methamphetamines    Comment: Last Use Meth- 05/22/16 and Last Use Cocaine- 05/05/16 (Per Patient)    Review of Systems  Constitutional: No fever/chills Eyes: No visual changes. ENT: No sore throat. Respiratory: Denies cough Cardiovascular: Denies chest pain Gastrointestinal: Denies abdominal pain Genitourinary: Negative for dysuria. Musculoskeletal: Negative for back pain. Skin: Negative for rash. Psychiatric: no mood changes,     ____________________________________________   PHYSICAL EXAM:  VITAL SIGNS: ED Triage Vitals  Enc Vitals Group     BP 03/29/20 1005 120/85     Pulse Rate 03/29/20 1005 97     Resp --      Temp 03/29/20 1005 98.4 F (36.9 C)     Temp Source 03/29/20 1005 Oral     SpO2 --      Weight 03/29/20 1005 196 lb 3.4  oz (89 kg)     Height 03/29/20 1005 5\' 2"  (1.575 m)     Head Circumference --      Peak Flow --      Pain Score 03/29/20 1005 10     Pain Loc --      Pain Edu? --      Excl. in GC? --     Constitutional: Alert and oriented. Well appearing and in no acute distress. Eyes: Conjunctivae are normal.  Head: Atraumatic. Nose: No congestion/rhinnorhea. Mouth/Throat: Mucous membranes are moist.   Neck:  supple no lymphadenopathy noted Cardiovascular: Normal rate, regular rhythm. Heart sounds are normal Respiratory: Normal respiratory effort.  No retractions, lungs c t a  Abd: soft nontender bs normal all 4 quad GU: deferred Musculoskeletal: FROM all extremities, warm and well perfused Neurologic:  Normal speech and language.  Skin:   Skin is warm, dry and intact. No rash noted. Psychiatric: Mood and affect are normal. Speech and behavior are normal.  ____________________________________________   LABS (all labs ordered are listed, but only abnormal results are displayed)  Labs Reviewed  COMPREHENSIVE METABOLIC PANEL - Abnormal; Notable for the following components:      Result Value   CO2 21 (*)    Glucose, Bld 103 (*)    Calcium 8.7 (*)    All other components within normal limits  URINALYSIS, ROUTINE W REFLEX MICROSCOPIC - Abnormal; Notable for the following components:   Color, Urine YELLOW (*)    APPearance HAZY (*)    Hgb urine dipstick LARGE (*)    Bacteria, UA RARE (*)    All other components within normal limits  CBC WITH DIFFERENTIAL/PLATELET - Abnormal; Notable for the following components:   RBC 5.34 (*)    Hemoglobin 15.7 (*)    HCT 46.2 (*)    All other components within normal limits  HCG, QUANTITATIVE, PREGNANCY  POC URINE PREG, ED   ____________________________________________   ____________________________________________  RADIOLOGY  Ultrasound pelvis Ct renal stone study  ____________________________________________   PROCEDURES  Procedure(s) performed: No  Procedures    ____________________________________________   INITIAL IMPRESSION / ASSESSMENT AND PLAN / ED COURSE  Pertinent labs & imaging results that were available during my care of the patient were reviewed by me and considered in my medical decision making (see chart for details).   Patient is 40 year old female presents with left lower quadrant pain and hematuria.  See HPI.  Physical exam shows patient to appear well.  DDx: Kidney stone, diverticulitis, pyelonephritis, UTI, pregnancy  Reports metabolic panel is normal, urinalysis has large amount of hemoglobin rare bacteria, no WBCs or RBCs noted, POC pregnancy test is negative  CBC and beta hCG are both pending  Due to the concerns of pregnancy by the  patient, will do ultrasound prior to CT   Ultrasound reviewed by me confirmed by radiology normal, CT renal stone study is negative for any acute abnormality  I did explain advised to the patient.  Feel she is having more abdominal spasms.  She be given Bentyl here in the ED.  Prescription for Bentyl at discharge.  She is to follow-up with GI doctor.  She is discharged stable condition.  Caitlin Schneider was evaluated in Emergency Department on 03/29/2020 for the symptoms described in the history of present illness. She was evaluated in the context of the global COVID-19 pandemic, which necessitated consideration that the patient might be at risk for infection with the SARS-CoV-2 virus that causes COVID-19. Institutional protocols and  algorithms that pertain to the evaluation of patients at risk for COVID-19 are in a state of rapid change based on information released by regulatory bodies including the CDC and federal and state organizations. These policies and algorithms were followed during the patient's care in the ED.    As part of my medical decision making, I reviewed the following data within the electronic MEDICAL RECORD NUMBER Nursing notes reviewed and incorporated, Labs reviewed , Old chart reviewed, Radiograph reviewed , Notes from prior ED visits and [place] Controlled Substance Database  ____________________________________________   FINAL CLINICAL IMPRESSION(S) / ED DIAGNOSES  Final diagnoses:  Left lower quadrant abdominal pain      NEW MEDICATIONS STARTED DURING THIS VISIT:  New Prescriptions   DICYCLOMINE (BENTYL) 10 MG CAPSULE    Take 1 capsule (10 mg total) by mouth 4 (four) times daily for 14 days.     Note:  This document was prepared using Dragon voice recognition software and may include unintentional dictation errors.    Faythe Ghee, PA-C 03/29/20 1356    Gilles Chiquito, MD 03/29/20 [phone]

## 2020-03-29 NOTE — ED Notes (Signed)
See triage note  Presents with abd pain   And states pain is moving into back  Last period was 5 months ago  States she has had positive home preg test  And has been seen for same  States she did note some blood in her urine this am

## 2020-03-29 NOTE — ED Triage Notes (Addendum)
C/O left lower back pain radiating to LLQ since last night.  Also c/o 'pink' to urine today.  Patient AAOx3.  Skin warm and dry.  Posture relaxed. Freedom of movement.  NAD

## 2020-09-01 ENCOUNTER — Emergency Department: Payer: Medicaid Other

## 2020-09-01 ENCOUNTER — Other Ambulatory Visit: Payer: Self-pay

## 2020-09-01 ENCOUNTER — Emergency Department
Admission: EM | Admit: 2020-09-01 | Discharge: 2020-09-02 | Disposition: A | Discharge status: Home / Self Care | Payer: Medicaid Other | Attending: Emergency Medicine | Admitting: Emergency Medicine

## 2020-09-01 DIAGNOSIS — Z20822 Contact with and (suspected) exposure to covid-19: Secondary | ICD-10-CM | POA: Insufficient documentation

## 2020-09-01 DIAGNOSIS — R079 Chest pain, unspecified: Secondary | ICD-10-CM | POA: Diagnosis not present

## 2020-09-01 DIAGNOSIS — F17210 Nicotine dependence, cigarettes, uncomplicated: Secondary | ICD-10-CM | POA: Insufficient documentation

## 2020-09-01 DIAGNOSIS — J40 Bronchitis, not specified as acute or chronic: Secondary | ICD-10-CM | POA: Diagnosis not present

## 2020-09-01 DIAGNOSIS — J4521 Mild intermittent asthma with (acute) exacerbation: Secondary | ICD-10-CM | POA: Insufficient documentation

## 2020-09-01 DIAGNOSIS — Z28310 Unvaccinated for covid-19: Secondary | ICD-10-CM | POA: Insufficient documentation

## 2020-09-01 DIAGNOSIS — J45901 Unspecified asthma with (acute) exacerbation: Secondary | ICD-10-CM | POA: Diagnosis not present

## 2020-09-01 DIAGNOSIS — R0602 Shortness of breath: Secondary | ICD-10-CM | POA: Diagnosis not present

## 2020-09-01 LAB — BASIC METABOLIC PANEL
Anion gap: 7 (ref 5–15)
BUN: 10 mg/dL (ref 6–20)
CO2: 22 mmol/L (ref 22–32)
Calcium: 7.7 mg/dL — ABNORMAL LOW (ref 8.9–10.3)
Chloride: 111 mmol/L (ref 98–111)
Creatinine, Ser: 0.84 mg/dL (ref 0.44–1.00)
GFR, Estimated: >60 mL/min (ref >60)
Glucose, Bld: 131 mg/dL — ABNORMAL HIGH (ref 70–99)
Potassium: 3.6 mmol/L (ref 3.5–5.1)
Sodium: 140 mmol/L (ref 135–145)

## 2020-09-01 LAB — CBC
HCT: 44.3 % (ref 36.0–46.0)
Hemoglobin: 14.8 g/dL (ref 12.0–15.0)
MCH: 28.9 pg (ref 26.0–34.0)
MCHC: 33.4 g/dL (ref 30.0–36.0)
MCV: 86.5 fL (ref 80.0–100.0)
Platelets: 258 10*3/uL (ref 150–400)
RBC: 5.12 MIL/uL — ABNORMAL HIGH (ref 3.87–5.11)
RDW: 15 % (ref 11.5–15.5)
WBC: 8.6 10*3/uL (ref 4.0–10.5)
nRBC: 0 % (ref 0.0–0.2)

## 2020-09-01 LAB — TROPONIN I (HIGH SENSITIVITY): Troponin I (High Sensitivity): 5 ng/L (ref <18)

## 2020-09-01 MED ORDER — METHYLPREDNISOLONE SODIUM SUCC 125 MG IJ SOLR
125.0000 mg | Freq: Once | INTRAMUSCULAR | Status: AC
  Administered 2020-09-02: 125 mg via INTRAVENOUS
  Filled 2020-09-01: qty 2

## 2020-09-01 MED ORDER — IPRATROPIUM-ALBUTEROL 0.5-2.5 (3) MG/3ML IN SOLN
3.0000 mL | Freq: Once | RESPIRATORY_TRACT | Status: AC
  Administered 2020-09-02: 3 mL via RESPIRATORY_TRACT
  Filled 2020-09-01: qty 3

## 2020-09-01 MED ORDER — HYDROCOD POLST-CPM POLST ER 10-8 MG/5ML PO SUER
5.0000 mL | Freq: Once | ORAL | Status: AC
  Administered 2020-09-02: 5 mL via ORAL
  Filled 2020-09-01: qty 5

## 2020-09-01 MED ORDER — KETOROLAC TROMETHAMINE 30 MG/ML IJ SOLN
15.0000 mg | Freq: Once | INTRAMUSCULAR | Status: AC
  Administered 2020-09-02: 15 mg via INTRAVENOUS
  Filled 2020-09-01: qty 1

## 2020-09-01 NOTE — ED Triage Notes (Signed)
Pt to ED for chest tightness and shob for a couple days, family has same sx. Expiratory wheezes noted.

## 2020-09-02 ENCOUNTER — Other Ambulatory Visit: Payer: Self-pay

## 2020-09-02 LAB — TROPONIN I (HIGH SENSITIVITY): Troponin I (High Sensitivity): 5 ng/L (ref <18)

## 2020-09-02 LAB — RESP PANEL BY RT-PCR (FLU A&B, COVID) ARPGX2
Influenza A by PCR: NEGATIVE
Influenza B by PCR: NEGATIVE
SARS Coronavirus 2 by RT PCR: NEGATIVE

## 2020-09-02 MED ORDER — HYDROCOD POLST-CPM POLST ER 10-8 MG/5ML PO SUER: 5.0000 mL | Freq: Two times a day (BID) | ORAL | 0 refills | Status: DC

## 2020-09-02 MED ORDER — PREDNISONE 20 MG PO TABS: ORAL_TABLET | ORAL | 0 refills | Status: DC

## 2020-09-02 MED ORDER — PREDNISONE 20 MG PO TABS
60.0000 mg | ORAL_TABLET | Freq: Every day | ORAL | 0 refills | Status: DC
  Filled 2020-09-02: qty 12, 4d supply, fill #0

## 2020-09-02 NOTE — Discharge Instructions (Addendum)
1.  Finish steroid as prescribed (Prednisone 60mg  daily x4 days). 2.  You may take Tussionex as needed for cough. 3.  Use your Albuterol inhaler and/or nebulizer every 4 hours as needed for difficulty breathing. 4.  Return to the ER for worsening symptoms, persistent vomiting, difficulty breathing or other concerns.

## 2020-09-02 NOTE — ED Provider Notes (Signed)
[facility] Emergency Department Provider Note   ____________________________________________   Event Date/Time   First MD Initiated Contact with Patient 09/01/20 2308     (approximate)  I have reviewed the triage vital signs and the nursing notes.   HISTORY  Chief Complaint Chest Pain and Shortness of Breath    HPI Caitlin Schneider is a 40 y.o. female who presents to the ED from home with a chief complaint of chills, nonproductive cough, chest tightness and shortness of breath x3 days.  Mother also sick with similar symptoms.  Patient has a history of asthma and has been experiencing wheezing.  Denies abdominal pain, nausea, vomiting or diarrhea.  Patient is unvaccinated against COVID-19.     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 Authorizing Provider  chlorpheniramine-HYDROcodone (TUSSIONEX PENNKINETIC ER) 10-8 MG/5ML SUER Take 5 mLs by mouth 2 (two) times daily. 09/02/20  Yes Irean Hong, MD  predniSONE (DELTASONE) 20 MG tablet 3 tablets daily x 4 days 09/02/20  Yes Irean Hong, MD  dicyclomine (BENTYL) 10 MG capsule Take 1 capsule (10 mg total) by mouth 4 (four) times daily for 14 days. 03/29/20 04/12/20  Faythe Ghee, PA-C    Allergies Patient has no known allergies.  Family History  Problem Relation Age  of Onset   COPD Mother    Diabetes Father    Kidney disease Father    Gout Brother    Pancreatic cancer Maternal Uncle    Diabetes Paternal Grandmother    Lung cancer Maternal Uncle     Social History Social History   Tobacco Use   Smoking status: Every Day    Packs/day: 0.50    Types: Cigarettes   Smokeless tobacco: Never  Vaping Use   Vaping Use: Never used  Substance Use Topics   Alcohol use: No   Drug use: Yes    Types: IV, Cocaine, Methamphetamines    Comment: Last Use Meth- 05/22/16 and Last Use Cocaine- 05/05/16 (Per Patient)    Review of Systems  Constitutional: No fever/chills Eyes: No visual changes. ENT: No sore throat. Cardiovascular: Positive for chest tightness. Respiratory: Positive for cough, wheezing and shortness of breath. Gastrointestinal: No abdominal pain.  No nausea, no vomiting.  No diarrhea.  No constipation. Genitourinary: Negative for dysuria. Musculoskeletal: Negative for back pain. Skin: Negative for rash. Neurological: Negative for headaches, focal weakness or numbness.   ____________________________________________   PHYSICAL EXAM:  VITAL SIGNS: ED Triage Vitals  Enc Vitals Group     BP 09/01/20 1711 118/61     Pulse Rate 09/01/20 1711 94     Resp 09/01/20 1711 (!) 22     Temp 09/01/20 1711 98.4 F (36.9 C)  Temp Source 09/01/20 1711 Oral     SpO2 09/01/20 1711 96 %     Weight 09/01/20 1712 183 lb (83 kg)     Height 09/01/20 1712 5\' 2"  (1.575 m)     Head Circumference --      Peak Flow --      Pain Score 09/01/20 1712 9     Pain Loc --      Pain Edu? --      Excl. in GC? --     Constitutional: Alert and oriented. Well appearing and in mild acute distress. Eyes: Conjunctivae are normal. PERRL. EOMI. Head: Atraumatic. Nose: Congestion/rhinnorhea. Mouth/Throat: Mucous membranes are moist.   Neck: No stridor.   Cardiovascular: Normal rate, regular rhythm. Grossly normal heart sounds.  Good peripheral  circulation. Respiratory: Normal respiratory effort.  No retractions. Lungs with expiratory wheezing.  Loose cough noted. Gastrointestinal: Soft and nontender to light or deep palpation. No distention. No abdominal bruits. No CVA tenderness. Musculoskeletal: No lower extremity tenderness nor edema.  No joint effusions. Neurologic:  Normal speech and language. No gross focal neurologic deficits are appreciated. No gait instability. Skin:  Skin is warm, dry and intact. No rash noted.  No petechiae. Psychiatric: Mood and affect are normal. Speech and behavior are normal.  ____________________________________________   LABS (all labs ordered are listed, but only abnormal results are displayed)  Labs Reviewed  BASIC METABOLIC PANEL - Abnormal; Notable for the following components:      Result Value   Glucose, Bld 131 (*)    Calcium 7.7 (*)    All other components within normal limits  CBC - Abnormal; Notable for the following components:   RBC 5.12 (*)    All other components within normal limits  RESP PANEL BY RT-PCR (FLU A&B, COVID) ARPGX2  POC URINE PREG, ED  TROPONIN I (HIGH SENSITIVITY)  TROPONIN I (HIGH SENSITIVITY)   ____________________________________________  EKG  ED ECG REPORT I, Gwendoline Judy J, the attending physician, personally viewed and interpreted this ECG.   Date: 09/01/2020  EKG Time: 1709  Rate: 95  Rhythm: normal sinus rhythm  Axis: RAD  Intervals:none  ST&T Change: Nonspecific  ____________________________________________  RADIOLOGY 1710 J, personally viewed and evaluated these images (plain radiographs) as part of my medical decision making, as well as reviewing the written report by the radiologist.  ED MD interpretation: No acute cardiopulmonary process  Official radiology report(s): DG Chest 2 View  Result Date: 09/01/2020 CLINICAL DATA:  Chest pain. EXAM: CHEST - 2 VIEW COMPARISON:  July 07, 2018. FINDINGS: The heart size and mediastinal  contours are within normal limits. Both lungs are clear. The visualized skeletal structures are unremarkable. IMPRESSION: No active cardiopulmonary disease. Electronically Signed   By: July 09, 2018 M.D.   On: 09/01/2020 17:43    ____________________________________________   PROCEDURES  Procedure(s) performed (including Critical Care):  .1-3 Lead EKG Interpretation  Date/Time: 09/01/2020 11:50 PM Performed by: 11/01/2020, MD Authorized by: Irean Hong, MD     Interpretation: normal     ECG rate:  75   ECG rate assessment: normal     Rhythm: sinus rhythm     Ectopy: none     Conduction: normal   Comments:     Patient placed on cardiac monitor to evaluate for arrhythmias   ____________________________________________   INITIAL IMPRESSION / ASSESSMENT AND PLAN / ED COURSE  As part of my medical decision making, I reviewed the following data within the electronic  MEDICAL RECORD NUMBER Nursing notes reviewed and incorporated, Labs reviewed, EKG interpreted, Old chart reviewed, Radiograph reviewed, and Notes from prior ED visits     40 year old female presenting with malaise, chills, cough, wheezing, chest tightness and shortness of breath. Differential includes, but is not limited to, viral syndrome, bronchitis including COPD exacerbation, pneumonia, reactive airway disease including asthma, CHF including exacerbation with or without pulmonary/interstitial edema, pneumothorax, ACS, thoracic trauma, and pulmonary embolism.   Laboratory results unremarkable.  Will check repeat troponin, COVID swab.  Administer IV Solu-Medrol, DuoNeb, Toradol, Tussionex and reassess.  Clinical Course as of 09/02/20 0355  Thu Sep 02, 2020  7001 Delay due to epic downtime.  Repeat troponin and respiratory panel negative.  Patient feeling better after IV Solu-Medrol and DuoNeb.  Will discharge home on prednisone burst, Tussionex.  Patient has albuterol MDI as well as nebulizer at home.  Strict return  precautions given.  Patient verbalizes understanding agrees with plan of care. [JS]    Clinical Course User Index [JS] Irean Hong, MD     ____________________________________________   FINAL CLINICAL IMPRESSION(S) / ED DIAGNOSES  Final diagnoses:  Mild asthma with exacerbation, unspecified whether persistent  Bronchitis     ED Discharge Orders          Ordered    predniSONE (DELTASONE) 20 MG tablet        09/02/20 0317    chlorpheniramine-HYDROcodone (TUSSIONEX PENNKINETIC ER) 10-8 MG/5ML SUER  2 times daily        09/02/20 7494             Note:  This document was prepared using Dragon voice recognition software and may include unintentional dictation errors.    Irean Hong, MD 09/02/20 [phone]

## 2020-09-03 ENCOUNTER — Other Ambulatory Visit: Payer: Self-pay

## 2020-09-03 ENCOUNTER — Telehealth: Payer: Self-pay

## 2020-09-03 DIAGNOSIS — Z79899 Other long term (current) drug therapy: Secondary | ICD-10-CM

## 2020-09-03 NOTE — Telephone Encounter (Signed)
Transition Care Management Follow-up Telephone Call Date of discharge and from where: 09/02/2020-[facility] How have you been since you were released from the hospital? Patient stated she is doing fine.  Any questions or concerns? No  Items Reviewed: Did the pt receive and understand the discharge instructions provided? Yes  Medications obtained and verified? Yes  Other? No  Any new allergies since your discharge? No  Dietary orders reviewed? N/A Do you have support at home? Yes   Home Care and Equipment/Supplies: Were home health services ordered? not applicable If so, what is the name of the agency? N/A  Has the agency set up a time to come to the patient's home? not applicable Were any new equipment or medical supplies ordered?  No What is the name of the medical supply agency? N/A Were you able to get the supplies/equipment? not applicable Do you have any questions related to the use of the equipment or supplies? No  Functional Questionnaire: (I = Independent and D = Dependent) ADLs: I  Bathing/Dressing- I  Meal Prep- II  Eating- I  Maintaining continence- I  Transferring/Ambulation- I  Managing Meds- I  Follow up appointments reviewed:  PCP Hospital f/u appt confirmed? No  Specialist Hospital f/u appt confirmed? No   Are transportation arrangements needed? No  If their condition worsens, is the pt aware to call PCP or go to the Emergency Dept.? Yes Was the patient provided with contact information for the PCP's office or ED? Yes Was to pt encouraged to call back with questions or concerns? Yes

## 2020-09-07 ENCOUNTER — Telehealth: Payer: Self-pay

## 2020-09-07 NOTE — Telephone Encounter (Signed)
   Telephone encounter was:  Successful.  09/07/2020 Name: Caitlin Schneider MRN: [id number] DOB: [birth year]  Caitlin Schneider is a 40 y.o. year old female who is a primary care patient of Pcp, No . The community resource team was consulted for assistance with  prescriptions.  Care guide performed the following interventions: Spoke with patient she has already been [facility].  They are unable to fill her prescription for Tussionex because the patient has Medicaid [facility].  I was unable to find a patient assistance program for Tussionex. Patient has applied for full Medicaid benefits.  Follow Up Plan:  Care guide will follow up with patient by phone over the next 7 days.  Caitlin Schneider, AAS Paralegal, [facility] Care Guide  Embedded Care Coordination [place]  Care Management  [address] [place], [place] [address] ??[email or web address]  ?? [phone]   [email or web address]

## 2020-09-07 NOTE — Telephone Encounter (Signed)
[facility] sent a message stating that patient wanted a call because she was not feeling well. Patient was provided 2 contacts to have follow up appointment done until she can get a PCP. Patient will call tomorrow to schedule her appointment for foot swelling.

## 2020-09-20 ENCOUNTER — Telehealth: Payer: Self-pay

## 2020-09-20 NOTE — Telephone Encounter (Signed)
   Telephone encounter was:  Successful.  09/20/2020 Name: Anniyah Mood MRN: [id number] DOB: 1980/08/19  Salam Chesterfield is a 40 y.o. year old female who is a primary care patient of Pcp, No . The community resource team was consulted for assistance with medication.   Care guide performed the following interventions: Spoke with patient her Medicaid benefits have been approved and she no longer needs medication assistance.   Follow Up Plan:  No further follow up planned at this time. The patient has been provided with needed resources.  Rheanna Sergent, AAS Paralegal, [facility] Care Guide  Embedded Care Coordination [place]  Care Management  [address] [place], [place] [address] ??[email or web address]  ?? [phone]   [email or web address]

## 2021-01-21 ENCOUNTER — Telehealth: Payer: Medicaid Other | Admitting: Physician Assistant

## 2021-01-21 DIAGNOSIS — B9689 Other specified bacterial agents as the cause of diseases classified elsewhere: Secondary | ICD-10-CM

## 2021-01-21 DIAGNOSIS — J208 Acute bronchitis due to other specified organisms: Secondary | ICD-10-CM

## 2021-01-21 MED ORDER — DOXYCYCLINE HYCLATE 100 MG PO TABS
100.0000 mg | ORAL_TABLET | Freq: Two times a day (BID) | ORAL | 0 refills | Status: DC
Start: 2021-01-21 — End: 2021-06-08

## 2021-01-21 MED ORDER — PREDNISONE 20 MG PO TABS: 40.0000 mg | ORAL_TABLET | Freq: Every day | ORAL | 0 refills | Status: DC

## 2021-01-21 MED ORDER — BENZONATATE 100 MG PO CAPS: 100.0000 mg | ORAL_CAPSULE | Freq: Three times a day (TID) | ORAL | 0 refills | Status: DC | PRN

## 2021-01-21 NOTE — Patient Instructions (Addendum)
Caitlin Schneider, thank you for joining [name], PA-C for today's virtual visit.  While this provider is not your primary care provider (PCP), if your PCP is located in our provider database this encounter information will be shared with them immediately following your visit.  Consent: (Patient) Caitlin Schneider provided verbal consent for this virtual visit at the beginning of the encounter.  Current Medications:  Current Outpatient Medications:    chlorpheniramine-HYDROcodone (TUSSIONEX PENNKINETIC ER) 10-8 MG/5ML SUER, Take 5 mLs by mouth 2 (two) times daily., Disp: 70 mL, Rfl: 0   dicyclomine (BENTYL) 10 MG capsule, Take 1 capsule (10 mg total) by mouth 4 (four) times daily for 14 days., Disp: 56 capsule, Rfl: 0   predniSONE (DELTASONE) 20 MG tablet, Take 3 tablets (60 mg total) by mouth once daily for 4 days., Disp: 12 tablet, Rfl: 0   predniSONE (DELTASONE) 20 MG tablet, 3 tablets daily x 4 days, Disp: 12 tablet, Rfl: 0   Medications ordered in this encounter:  No orders of the defined types were placed in this encounter.    *If you need refills on other medications prior to your next appointment, please contact your pharmacy*  Follow-Up: Call back or seek an in-person evaluation if the symptoms worsen or if the condition fails to improve as anticipated.  Other Instructions Take antibiotic (Doxycycline) as directed.  Increase fluids.  Get plenty of rest. Use Mucinex for congestion. Use the prednisone and cough medications as directed. Take a daily probiotic (I recommend Align or Culturelle, but even Activia Yogurt may be beneficial).  A humidifier placed in the bedroom may offer some relief for a dry, scratchy throat of nasal irritation.  Read information below on acute bronchitis. Please call or return to clinic if symptoms are not improving.  [email or web address] -- link to get daughter set up an account for a visit.  She can also go to [email or web address] to set  up a MyChart account and then sign up from there.     Acute Bronchitis Bronchitis is when the airways that extend from the windpipe into the lungs get red, puffy, and painful (inflamed). Bronchitis often causes thick spit (mucus) to develop. This leads to a cough. A cough is the most common symptom of bronchitis. In acute bronchitis, the condition usually begins suddenly and goes away over time (usually in 2 weeks). Smoking, allergies, and asthma can make bronchitis worse. Repeated episodes of bronchitis may cause more lung problems.  HOME CARE Rest. Drink enough fluids to keep your pee (urine) clear or pale yellow (unless you need to limit fluids as told by your doctor). Only take over-the-counter or prescription medicines as told by your doctor. Avoid smoking and secondhand smoke. These can make bronchitis worse. If you are a smoker, think about using nicotine gum or skin patches. Quitting smoking will help your lungs heal faster. Reduce the chance of getting bronchitis again by: Washing your hands often. Avoiding people with cold symptoms. Trying not to touch your hands to your mouth, nose, or eyes. Follow up with your doctor as told.  GET HELP IF: Your symptoms do not improve after 1 week of treatment. Symptoms include: Cough. Fever. Coughing up thick spit. Body aches. Chest congestion. Chills. Shortness of breath. Sore throat.  GET HELP RIGHT AWAY IF:  You have an increased fever. You have chills. You have severe shortness of breath. You have bloody thick spit (sputum). You throw up (vomit) often. You lose too much body fluid (dehydration). You have a  severe headache. You faint.  MAKE SURE YOU:  Understand these instructions. Will watch your condition. Will get help right away if you are not doing well or get worse. Document Released: 06/28/2007 Document Revised: 09/11/2012 Document Reviewed: 07/02/2012 [facility] Patient Information 2015 [place], [place]. This  information is not intended to replace advice given to you by your health care provider. Make sure you discuss any questions you have with your health care provider.    If you have been instructed to have an in-person evaluation today at a local Urgent Care facility, please use the link below. It will take you to a list of all of our available [facility], including address, phone number and hours of operation. Please do not delay care.  [facility]  If you or a family member do not have a primary care provider, use the link below to schedule a visit and establish care. When you choose a [facility] primary care physician or advanced practice provider, you gain a long-term partner in health. Find a Primary Care Provider  Learn more about [facility]'s in-office and virtual care options: [place] - Get Care Now

## 2021-01-21 NOTE — Progress Notes (Signed)
Virtual Visit Consent   [name], you are scheduled for a virtual visit with a [facility] provider today.     Just as with appointments in the office, your consent must be obtained to participate.  Your consent will be active for this visit and any virtual visit you may have with one of our providers in the next 365 days.     If you have a MyChart account, a copy of this consent can be sent to you electronically.  All virtual visits are billed to your insurance company just like a traditional visit in the office.    As this is a virtual visit, video technology does not allow for your provider to perform a traditional examination.  This may limit your provider's ability to fully assess your condition.  If your provider identifies any concerns that need to be evaluated in person or the need to arrange testing (such as labs, EKG, etc.), we will make arrangements to do so.     Although advances in technology are sophisticated, we cannot ensure that it will always work on either your end or our end.  If the connection with a video visit is poor, the visit may have to be switched to a telephone visit.  With either a video or telephone visit, we are not always able to ensure that we have a secure connection.     I need to obtain your verbal consent now.   Are you willing to proceed with your visit today?    Caitlin Schneider has provided verbal consent on 01/21/2021 for a virtual visit (video or telephone).   Caitlin Schneider, [place]   Date: 01/21/2021 2:53 PM   Virtual Visit via Video Note   I, Caitlin Schneider, connected with  Coline Calkin  ([id number], September 11, 1980) on 01/21/21 at  2:45 PM EST by a video-enabled telemedicine application and verified that I am speaking with the correct person using two identifiers.  Location: Patient: Virtual Visit Location Patient: Home Provider: Virtual Visit Location Provider: Home Office   I discussed the limitations of evaluation and management by  telemedicine and the availability of in person appointments. The patient expressed understanding and agreed to proceed.    History of Present Illness: Caitlin Schneider is a 40 y.o. who identifies as a female who was assigned female at birth, and is being seen today for 2 weeks of worsening chest congestion, cough that is productive of thick phlegm whereas it was initially dry. Also noting some chest tightness. As of today she is noting L ear fullness and clogged sensation. Denies any drainage from the ear.   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   Left arm cellulitis    Abscess    Sepsis (HCC) 05/30/2016   Cellulitis 05/30/2016   IV drug user 05/30/2016    Allergies: No Known Allergies Medications:  Current Outpatient Medications:    albuterol (VENTOLIN HFA) 108 (90 Base) MCG/ACT inhaler, Inhale into the lungs every 6 (six) hours as needed., Disp: , Rfl:    benzonatate (TESSALON) 100 MG capsule, Take 1 capsule (100 mg total) by mouth 3 (three) times daily as needed for cough., Disp: 30 capsule, Rfl: 0   doxycycline (VIBRA-TABS) 100 MG tablet, Take 1 tablet (100 mg total) by mouth 2 (two) times daily., Disp: 14 tablet, Rfl: 0   predniSONE (DELTASONE) 20 MG tablet, Take 2 tablets (40 mg total) by  mouth daily with breakfast., Disp: 10 tablet, Rfl: 0  Observations/Objective: Patient is well-developed, well-nourished in no acute distress.  Resting comfortably at home.  Head is normocephalic, atraumatic.  No labored breathing. Speech is clear and coherent with logical content.  Patient is alert and oriented at baseline.   Assessment and Plan: 1. Acute bacterial bronchitis - benzonatate (TESSALON) 100 MG capsule; Take 1 capsule (100 mg total) by mouth 3 (three) times daily as needed for cough.  Dispense: 30 capsule; Refill: 0 - predniSONE (DELTASONE) 20 MG tablet; Take 2 tablets (40 mg total) by mouth  daily with breakfast.  Dispense: 10 tablet; Refill: 0 - doxycycline (VIBRA-TABS) 100 MG tablet; Take 1 tablet (100 mg total) by mouth 2 (two) times daily.  Dispense: 14 tablet; Refill: 0  Rx Doxycycline.  Increase fluids.  Rest.  Saline nasal spray.  Probiotic.  Mucinex as directed.  Humidifier in bedroom. Tessalon and Prednisone per orders.  Call or return to clinic if symptoms are not improving.   Follow Up Instructions: I discussed the assessment and treatment plan with the patient. The patient was provided an opportunity to ask questions and all were answered. The patient agreed with the plan and demonstrated an understanding of the instructions.  A copy of instructions were sent to the patient via MyChart unless otherwise noted below.   The patient was advised to call back or seek an in-person evaluation if the symptoms worsen or if the condition fails to improve as anticipated.  Time:  I spent 10 minutes with the patient via telehealth technology discussing the above problems/concerns.    Caitlin Climes, PA-C

## 2021-04-13 ENCOUNTER — Encounter: Payer: Self-pay | Admitting: Obstetrics and Gynecology

## 2021-04-13 DIAGNOSIS — Z01419 Encounter for gynecological examination (general) (routine) without abnormal findings: Secondary | ICD-10-CM

## 2021-04-13 DIAGNOSIS — Z124 Encounter for screening for malignant neoplasm of cervix: Secondary | ICD-10-CM

## 2021-05-18 DIAGNOSIS — M65311 Trigger thumb, right thumb: Secondary | ICD-10-CM | POA: Diagnosis not present

## 2021-06-01 ENCOUNTER — Encounter: Payer: Medicaid Other | Admitting: Obstetrics and Gynecology

## 2021-06-01 DIAGNOSIS — Z7689 Persons encountering health services in other specified circumstances: Secondary | ICD-10-CM

## 2021-06-01 DIAGNOSIS — Z01419 Encounter for gynecological examination (general) (routine) without abnormal findings: Secondary | ICD-10-CM

## 2021-06-08 ENCOUNTER — Encounter: Payer: Self-pay | Admitting: Obstetrics and Gynecology

## 2021-06-08 ENCOUNTER — Other Ambulatory Visit (HOSPITAL_COMMUNITY)
Admission: RE | Admit: 2021-06-08 | Discharge: 2021-06-08 | Disposition: A | Discharge status: Home / Self Care | Payer: Medicaid Other | Source: Ambulatory Visit | Attending: Obstetrics and Gynecology | Admitting: Obstetrics and Gynecology

## 2021-06-08 ENCOUNTER — Ambulatory Visit (INDEPENDENT_AMBULATORY_CARE_PROVIDER_SITE_OTHER): Payer: Medicaid Other | Admitting: Obstetrics and Gynecology

## 2021-06-08 ENCOUNTER — Encounter: Payer: Medicaid Other | Admitting: Obstetrics and Gynecology

## 2021-06-08 VITALS — BP 102/70 | HR 74 | Ht 62.0 in | Wt 229.7 lb

## 2021-06-08 DIAGNOSIS — Z01419 Encounter for gynecological examination (general) (routine) without abnormal findings: Secondary | ICD-10-CM | POA: Diagnosis not present

## 2021-06-08 DIAGNOSIS — Z1231 Encounter for screening mammogram for malignant neoplasm of breast: Secondary | ICD-10-CM | POA: Diagnosis not present

## 2021-06-08 DIAGNOSIS — N914 Secondary oligomenorrhea: Secondary | ICD-10-CM

## 2021-06-08 DIAGNOSIS — Z124 Encounter for screening for malignant neoplasm of cervix: Secondary | ICD-10-CM | POA: Insufficient documentation

## 2021-06-08 NOTE — Progress Notes (Signed)
HPI: ?     [name] is a 41 y.o. [phone] who LMP was Patient's last menstrual period was 02/06/2021 (approximate). ? ?Subjective:  ? ?She presents today for her annual examination.  She states that she has not had a period in 4 months.  She states that this has happened before.  She was given a medication to bring on her menstrual cycle last time it happened.  No further investigation was performed. ?She is due for a Pap smear and a mammogram.  She is not fasting today and would like to return for blood work. ? ?  Hx: ?The following portions of the patient's history were reviewed and updated as appropriate: ?            She  has a past medical history of Anxiety, Asthma, Depression, IV drug user, and Substance abuse (HCC). ?She does not have any pertinent problems on file. ?She  has a past surgical history that includes Cesarean section and Incision and drainage abscess (Left, 05/31/2016). ?Her family history includes COPD in her mother; Diabetes in her father and paternal grandmother; Gout in her brother; Kidney disease in her father; Lung cancer in her maternal uncle; Pancreatic cancer in her maternal uncle. ?She  reports that she quit smoking about 2 months ago. Her smoking use included cigarettes. She smoked an average of .5 packs per day. She has never used smokeless tobacco. She reports current drug use. Drugs: IV, Cocaine, Methamphetamines, and Marijuana. She reports that she does not drink alcohol. ?She has a current medication list which includes the following prescription(s): albuterol and meloxicam. ?She has No Known Allergies. ?      ?Review of Systems:  ?Review of Systems ? ?Constitutional: Denied constitutional symptoms, night sweats, recent illness, fatigue, fever, insomnia and weight loss.  ?Eyes: Denied eye symptoms, eye pain, photophobia, vision change and visual disturbance.  ?Ears/Nose/Throat/Neck: Denied ear, nose, throat or neck symptoms, hearing loss, nasal discharge, sinus congestion  and sore throat.  ?Cardiovascular: Denied cardiovascular symptoms, arrhythmia, chest pain/pressure, edema, exercise intolerance, orthopnea and palpitations.  ?Respiratory: Denied pulmonary symptoms, asthma, pleuritic pain, productive sputum, cough, dyspnea and wheezing.  ?Gastrointestinal: Denied, gastro-esophageal reflux, melena, nausea and vomiting.  ?Genitourinary: See HPI for additional information.  ?Musculoskeletal: Denied musculoskeletal symptoms, stiffness, swelling, muscle weakness and myalgia.  ?Dermatologic: Denied dermatology symptoms, rash and scar.  ?Neurologic: Denied neurology symptoms, dizziness, headache, neck pain and syncope.  ?Psychiatric: Denied psychiatric symptoms, anxiety and depression.  ?Endocrine: Denied endocrine symptoms including hot flashes and night sweats.  ? ?Meds: ?  ?Current Outpatient Medications on File Prior to Visit  ?Medication Sig Dispense Refill  ? albuterol (VENTOLIN HFA) 108 (90 Base) MCG/ACT inhaler Inhale into the lungs every 6 (six) hours as needed.    ? meloxicam (MOBIC) 15 MG tablet Take 15 mg by mouth daily.    ? ?No current facility-administered medications on file prior to visit.  ? ? ? ?Objective:  ?  ? ?Vitals:  ? 06/08/21 1335  ?BP: 102/70  ?Pulse: 74  ?  ?Filed Weights  ? 06/08/21 1335  ?Weight: 229 lb 11.2 oz (104.2 kg)  ? ?  ?         Physical examination ?General NAD, Conversant  ?HEENT Atraumatic; Op clear with mmm.  Normo-cephalic. Pupils reactive. Anicteric sclerae  ?Thyroid/Neck Smooth without nodularity or enlargement. Normal ROM.  Neck Supple.  ?Skin No rashes, lesions or ulceration. Normal palpated skin turgor. No nodularity.  ?Breasts: No masses or discharge.  Symmetric.  No axillary adenopathy.  ?Lungs: Clear to auscultation.No rales or wheezes. Normal Respiratory effort, no retractions.  ?Heart: NSR.  No murmurs or rubs appreciated. No periferal edema  ?Abdomen: Soft.  Non-tender.  No masses.  No HSM. No hernia  ?Extremities: Moves all  appropriately.  Normal ROM for age. No lymphadenopathy.  ?Neuro: Oriented to PPT.  Normal mood. Normal affect.  ? ?  Pelvic:   ?Vulva: Normal appearance.  No lesions.  ?Vagina: No lesions or abnormalities noted.  ?Support: Normal pelvic support.  ?Urethra No masses tenderness or scarring.  ?Meatus Normal size without lesions or prolapse.  ?Cervix: Normal appearance.  No lesions.  ?Anus: Normal exam.  No lesions.  ?Perineum: Normal exam.  No lesions.  ?      Bimanual   ?Uterus: Normal size.  Non-tender.  Mobile.  AV.  ?Adnexae: No masses.  Non-tender to palpation.  ?Cul-de-sac: Negative for abnormality.  ? ? ? ?Assessment:  ?  ?[id number] ?Patient Active Problem List  ? Diagnosis Date Noted  ? 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 Left arm cellulitis   ? Abscess   ? Sepsis (HCC) 05/30/2016  ? Cellulitis 05/30/2016  ? IV drug user 05/30/2016  ? ?  ?1. Well woman exam with routine gynecological exam   ?2. Cervical cancer screening   ?3. Screening mammogram for breast cancer   ?4. Secondary oligomenorrhea   ? ?  ? ? ?Plan:  ?  ?       ? 1.  Basic Screening Recommendations ?The basic screening recommendations for asymptomatic women were discussed with the patient during her visit.  The age-appropriate recommendations were discussed with her and the rational for the tests reviewed.  When I am informed by the patient that another primary care physician has previously obtained the age-appropriate tests and they are up-to-date, only outstanding tests are ordered and referrals given as necessary.  Abnormal results of tests will be discussed with her when all of her results are completed.  Routine preventative health maintenance measures emphasized: Exercise/Diet/Weight control, Tobacco Warnings, Alcohol/Substance use risks and Stress Management ?Pap performed-mammogram ordered-blood work ordered patient will return when fasting. ?2.  PCO labs ?Patient to contact us if she has not had a menstrual period in the  next 4 weeks. ?Orders ?Orders Placed This Encounter  ?Procedures  ? MM DIGITAL SCREENING BILATERAL  ? CBC  ? Basic metabolic panel  ? TSH  ? Lipid panel  ? Hemoglobin A1c  ? DHEA-sulfate  ? FSH/LH  ? Glucose, fasting  ? Testosterone, Free, Total, SHBG  ? Prolactin  ? Insulin, random  ? ? No orders of the defined types were placed in this encounter. ?    ?  ?  F/U ? No follow-ups on file. ? ?Elonda Husky, M.D. ?06/08/2021 ?2:15 PM ? ? ? ?

## 2021-06-08 NOTE — Progress Notes (Signed)
Patients presents for annual exam today. She states she has not had a period in four months where she has bled, reports having cramping and bloating symptoms. Patient is due for pap smear, ordered. Patient is due for mammogram, ordered. Patients annual labs are ordered.  ?Patient states no other questions or concerns at this time.   ?

## 2021-06-09 ENCOUNTER — Other Ambulatory Visit: Payer: Self-pay | Admitting: Obstetrics and Gynecology

## 2021-06-09 ENCOUNTER — Other Ambulatory Visit: Payer: Medicaid Other

## 2021-06-09 DIAGNOSIS — N914 Secondary oligomenorrhea: Secondary | ICD-10-CM | POA: Diagnosis not present

## 2021-06-11 LAB — TESTOSTERONE, FREE, TOTAL, SHBG
Sex Hormone Binding: 14.9 nmol/L — ABNORMAL LOW (ref 24.6–122.0)
Testosterone, Free: 1 pg/mL (ref 0.0–4.2)
Testosterone: 13 ng/dL (ref 8–60)

## 2021-06-11 LAB — BASIC METABOLIC PANEL
BUN/Creatinine Ratio: 23 (ref 9–23)
BUN: 23 mg/dL (ref 6–24)
CO2: 21 mmol/L (ref 20–29)
Calcium: 8.8 mg/dL (ref 8.7–10.2)
Chloride: 106 mmol/L (ref 96–106)
Creatinine, Ser: 0.99 mg/dL (ref 0.57–1.00)
Glucose, Plasma: 81 mg/dL (ref 70–99)
Potassium: 4.8 mmol/L (ref 3.5–5.2)
Sodium: 142 mmol/L (ref 134–144)
eGFR: 74 mL/min/{1.73_m2} (ref >59)

## 2021-06-11 LAB — HEMOGLOBIN A1C
Est. average glucose Bld gHb Est-mCnc: 120 mg/dL
Hgb A1c MFr Bld: 5.8 % — ABNORMAL HIGH (ref 4.8–5.6)

## 2021-06-11 LAB — CBC
Hematocrit: 46.7 % — ABNORMAL HIGH (ref 34.0–46.6)
Hemoglobin: 15.8 g/dL (ref 11.1–15.9)
MCH: 29.3 pg (ref 26.6–33.0)
MCHC: 33.8 g/dL (ref 31.5–35.7)
MCV: 87 fL (ref 79–97)
Platelets: 292 10*3/uL (ref 150–450)
RBC: 5.39 x10E6/uL — ABNORMAL HIGH (ref 3.77–5.28)
RDW: 13.2 % (ref 11.7–15.4)
WBC: 7 10*3/uL (ref 3.4–10.8)

## 2021-06-11 LAB — LIPID PANEL
Chol/HDL Ratio: 4.3 ratio (ref 0.0–4.4)
Cholesterol, Total: 164 mg/dL (ref 100–199)
HDL: 38 mg/dL — ABNORMAL LOW (ref >39)
LDL Chol Calc (NIH): 97 mg/dL (ref 0–99)
Triglycerides: 165 mg/dL — ABNORMAL HIGH (ref 0–149)
VLDL Cholesterol Cal: 29 mg/dL (ref 5–40)

## 2021-06-11 LAB — FSH/LH
FSH: 58.9 m[IU]/mL
LH: 39.7 m[IU]/mL

## 2021-06-11 LAB — DHEA-SULFATE: DHEA-SO4: 167 ug/dL (ref 57.3–279.2)

## 2021-06-11 LAB — TSH: TSH: 4.01 u[IU]/mL (ref 0.450–4.500)

## 2021-06-11 LAB — PROLACTIN: Prolactin: 25.1 ng/mL — ABNORMAL HIGH (ref 4.8–23.3)

## 2021-06-11 LAB — INSULIN, RANDOM: INSULIN: 34.7 u[IU]/mL — ABNORMAL HIGH (ref 2.6–24.9)

## 2021-06-14 LAB — CYTOLOGY - PAP
Adequacy: ABSENT
Comment: NEGATIVE
Diagnosis: NEGATIVE
High risk HPV: NEGATIVE

## 2021-06-26 NOTE — Progress Notes (Signed)
Based on these results, please have her schedule a visit with me to discuss them.

## 2021-06-27 NOTE — Progress Notes (Signed)
Scheduled 6/14

## 2021-06-30 ENCOUNTER — Other Ambulatory Visit: Payer: Self-pay | Admitting: Obstetrics and Gynecology

## 2021-06-30 ENCOUNTER — Ambulatory Visit
Admission: RE | Admit: 2021-06-30 | Discharge: 2021-06-30 | Disposition: A | Discharge status: Home / Self Care | Payer: Medicaid Other | Source: Ambulatory Visit | Attending: Obstetrics and Gynecology | Admitting: Obstetrics and Gynecology

## 2021-06-30 DIAGNOSIS — Z1159 Encounter for screening for other viral diseases: Secondary | ICD-10-CM | POA: Diagnosis not present

## 2021-06-30 DIAGNOSIS — Z0000 Encounter for general adult medical examination without abnormal findings: Secondary | ICD-10-CM | POA: Diagnosis not present

## 2021-06-30 DIAGNOSIS — R928 Other abnormal and inconclusive findings on diagnostic imaging of breast: Secondary | ICD-10-CM

## 2021-06-30 DIAGNOSIS — Z01419 Encounter for gynecological examination (general) (routine) without abnormal findings: Secondary | ICD-10-CM

## 2021-06-30 DIAGNOSIS — M2550 Pain in unspecified joint: Secondary | ICD-10-CM | POA: Diagnosis not present

## 2021-06-30 DIAGNOSIS — Z114 Encounter for screening for human immunodeficiency virus [HIV]: Secondary | ICD-10-CM | POA: Diagnosis not present

## 2021-06-30 DIAGNOSIS — N63 Unspecified lump in unspecified breast: Secondary | ICD-10-CM

## 2021-06-30 DIAGNOSIS — R413 Other amnesia: Secondary | ICD-10-CM | POA: Diagnosis not present

## 2021-06-30 DIAGNOSIS — Z1231 Encounter for screening mammogram for malignant neoplasm of breast: Secondary | ICD-10-CM

## 2021-07-06 ENCOUNTER — Encounter: Payer: Self-pay | Admitting: Obstetrics and Gynecology

## 2021-07-06 ENCOUNTER — Ambulatory Visit: Payer: Medicaid Other | Admitting: Obstetrics and Gynecology

## 2021-07-06 VITALS — BP 115/78 | HR 94 | Ht 62.0 in | Wt 229.8 lb

## 2021-07-06 DIAGNOSIS — N63 Unspecified lump in unspecified breast: Secondary | ICD-10-CM

## 2021-07-06 DIAGNOSIS — E8881 Metabolic syndrome: Secondary | ICD-10-CM

## 2021-07-06 DIAGNOSIS — N951 Menopausal and female climacteric states: Secondary | ICD-10-CM | POA: Diagnosis not present

## 2021-07-06 DIAGNOSIS — R7989 Other specified abnormal findings of blood chemistry: Secondary | ICD-10-CM | POA: Diagnosis not present

## 2021-07-06 DIAGNOSIS — N914 Secondary oligomenorrhea: Secondary | ICD-10-CM

## 2021-07-06 DIAGNOSIS — N911 Secondary amenorrhea: Secondary | ICD-10-CM | POA: Diagnosis not present

## 2021-07-06 NOTE — Progress Notes (Signed)
HPI:      [name] is a 41 y.o. [phone] who LMP was No LMP recorded (lmp unknown).  Subjective:   She presents today to discuss her mammogram and her most recent lab work performed because she has missed multiple menstrual periods.  She states that she had occasionally has hot flashes but some days does not. Patient is interested in having another child if possible.     Hx: The following portions of the patient's history were reviewed and updated as appropriate:             She  has a past medical history of Anxiety, Asthma, Depression, IV drug user, and Substance abuse (HCC). She does not have any pertinent problems on file. She  has a past surgical history that includes Cesarean section and Incision and drainage abscess (Left, 05/31/2016). Her family history includes Breast cancer in her cousin; COPD in her mother; Diabetes in her father and paternal grandmother; Gout in her brother; Kidney disease in her father; Lung cancer in her maternal uncle; Pancreatic cancer in her maternal uncle. She  reports that she quit smoking about 2 months ago. Her smoking use included cigarettes. She smoked an average of .5 packs per day. She has never used smokeless tobacco. She reports current drug use. Drugs: IV, Cocaine, Methamphetamines, and Marijuana. She reports that she does not drink alcohol. She has a current medication list which includes the following prescription(s): albuterol and meloxicam. She has No Known Allergies.       Review of Systems:  Review of Systems  Constitutional: Denied constitutional symptoms, night sweats, recent illness, fatigue, fever, insomnia and weight loss.  Eyes: Denied eye symptoms, eye pain, photophobia, vision change and visual disturbance.  Ears/Nose/Throat/Neck: Denied ear, nose, throat or neck symptoms, hearing loss, nasal discharge, sinus congestion and sore throat.  Cardiovascular: Denied cardiovascular symptoms, arrhythmia, chest pain/pressure, edema,  exercise intolerance, orthopnea and palpitations.  Respiratory: Denied pulmonary symptoms, asthma, pleuritic pain, productive sputum, cough, dyspnea and wheezing.  Gastrointestinal: Denied, gastro-esophageal reflux, melena, nausea and vomiting.  Genitourinary: See HPI for additional information.  Musculoskeletal: Denied musculoskeletal symptoms, stiffness, swelling, muscle weakness and myalgia.  Dermatologic: Denied dermatology symptoms, rash and scar.  Neurologic: Denied neurology symptoms, dizziness, headache, neck pain and syncope.  Psychiatric: Denied psychiatric symptoms, anxiety and depression.  Endocrine: See HPI for additional information.   Meds:   Current Outpatient Medications on File Prior to Visit  Medication Sig Dispense Refill   albuterol (VENTOLIN HFA) 108 (90 Base) MCG/ACT inhaler Inhale into the lungs every 6 (six) hours as needed.     meloxicam (MOBIC) 15 MG tablet Take 15 mg by mouth daily.     No current facility-administered medications on file prior to visit.      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 Amenorrhea, secondary   2. Symptomatic menopausal or female climacteric states   3. Elevated prolactin level  4. Breast mass seen on mammogram   5. Insulin resistance     Patient has secondary amenorrhea and according to her [facility] results that she is menopausal.  This is certainly early menopause at the age of 72.  This also goes along with some of her signs and symptoms of menopause including vaginal hot flashes.  Patient is understandably upset as she was considering future  childbearing.  She would like to have additional testing to see if she has any at all functioning eggs left.  Elevated prolactin may be secondary to menopausal state-doubt causing amenorrhea.  Insulin resistance-prediabetes.  She has already been told that she has this by her primary care doctor.  Breast mass requiring follow-up mammography.   Plan:            1.  Patient will follow-up with her mammogram.  She is scheduling a time for this.  2.  Anti-mllerian hormone ordered at patient request.  She desires childbirth if possible  3.  If her anti-mllerian hormone shows no functioning follicles I would then recommend some type of HRT because of her young age.  4.  After beginning HRT with plan retest for prolactin.  5.  Patient notified of her prediabetic condition and she plans to talk to her family physician further about this in the future.  Orders Orders Placed This Encounter  Procedures   Anti mullerian hormone    No orders of the defined types were placed in this encounter.     F/U  Return in about 3 weeks (around 07/27/2021) for We will contact her with any abnormal test results. I spent 32 minutes involved in the care of this patient preparing to see the patient by obtaining and reviewing her medical history (including labs, imaging tests and prior procedures), documenting clinical information in the electronic health record (EHR), counseling and coordinating care plans, writing and sending prescriptions, ordering tests or procedures and in direct communicating with the patient and medical staff discussing pertinent items from her history and physical exam.  Elonda Husky, M.D. 07/06/2021 2:52 PM

## 2021-07-06 NOTE — Progress Notes (Signed)
Patient presents today to discuss recent lab results. She states recent mammogram showed possible breast mass and needs additional imaging. Patient states no other questions at this time.

## 2021-07-12 LAB — ANTI MULLERIAN HORMONE: ANTI-MULLERIAN HORMONE (AMH): <0.015 ng/mL

## 2021-07-18 ENCOUNTER — Ambulatory Visit: Payer: Medicaid Other | Admitting: Nurse Practitioner

## 2021-07-19 ENCOUNTER — Ambulatory Visit
Admission: RE | Admit: 2021-07-19 | Discharge: 2021-07-19 | Disposition: A | Discharge status: Home / Self Care | Payer: Medicaid Other | Source: Ambulatory Visit | Attending: Obstetrics and Gynecology | Admitting: Obstetrics and Gynecology

## 2021-07-19 DIAGNOSIS — R928 Other abnormal and inconclusive findings on diagnostic imaging of breast: Secondary | ICD-10-CM

## 2021-07-19 DIAGNOSIS — N63 Unspecified lump in unspecified breast: Secondary | ICD-10-CM

## 2021-07-28 ENCOUNTER — Other Ambulatory Visit: Payer: Self-pay

## 2021-07-28 ENCOUNTER — Ambulatory Visit (INDEPENDENT_AMBULATORY_CARE_PROVIDER_SITE_OTHER): Payer: Medicaid Other | Admitting: Obstetrics and Gynecology

## 2021-07-28 ENCOUNTER — Encounter: Payer: Self-pay | Admitting: Obstetrics and Gynecology

## 2021-07-28 VITALS — BP 112/79 | HR 85 | Ht 62.0 in | Wt 231.6 lb

## 2021-07-28 DIAGNOSIS — E2839 Other primary ovarian failure: Secondary | ICD-10-CM

## 2021-07-28 DIAGNOSIS — Z79890 Hormone replacement therapy (postmenopausal): Secondary | ICD-10-CM | POA: Diagnosis not present

## 2021-07-28 MED ORDER — MEDROXYPROGESTERONE ACETATE 2.5 MG PO TABS
2.5000 mg | ORAL_TABLET | Freq: Every day | ORAL | 1 refills | Status: DC
  Filled 2021-07-28 – 2021-08-03 (×2): qty 90, 90d supply, fill #0

## 2021-07-28 MED ORDER — ESTRADIOL 2 MG PO TABS
2.0000 mg | ORAL_TABLET | Freq: Every day | ORAL | 1 refills | Status: DC
  Filled 2021-07-28 – 2021-08-03 (×2): qty 90, 90d supply, fill #0

## 2021-07-28 NOTE — Progress Notes (Signed)
HPI:      Ms. Ardyn Forge is a 41 y.o. [phone] who LMP was No LMP recorded (lmp unknown).  Subjective:   She presents today to discuss her most recent lab results which reveal the same as her [facility] testing which showed she is in menopause.  Her anti-mllerian hormone shows she has no significant eggs in reserve. This is not good news for her because she was considering having another child. She reports that 2 years ago she went to the doctor because her cycles were becoming very irregular and no diagnosis was made.    Hx: The following portions of the patient's history were reviewed and updated as appropriate:             She  has a past medical history of Anxiety, Asthma, Depression, IV drug user, and Substance abuse (HCC). She does not have any pertinent problems on file. She  has a past surgical history that includes Cesarean section and Incision and drainage abscess (Left, 05/31/2016). Her family history includes Breast cancer in her cousin; COPD in her mother; Diabetes in her father and paternal grandmother; Gout in her brother; Kidney disease in her father; Lung cancer in her maternal uncle; Pancreatic cancer in her maternal uncle. She  reports that she quit smoking about 3 months ago. Her smoking use included cigarettes. She smoked an average of .5 packs per day. She has never used smokeless tobacco. She reports current drug use. Drugs: IV, Cocaine, Methamphetamines, and Marijuana. She reports that she does not drink alcohol. She has a current medication list which includes the following prescription(s): albuterol, estradiol, medroxyprogesterone, and meloxicam. She has No Known Allergies.       Review of Systems:  Review of Systems  Constitutional: Denied constitutional symptoms, night sweats, recent illness, fatigue, fever, insomnia and weight loss.  Eyes: Denied eye symptoms, eye pain, photophobia, vision change and visual disturbance.  Ears/Nose/Throat/Neck: Denied ear, nose, throat or  neck symptoms, hearing loss, nasal discharge, sinus congestion and sore throat.  Cardiovascular: Denied cardiovascular symptoms, arrhythmia, chest pain/pressure, edema, exercise intolerance, orthopnea and palpitations.  Respiratory: Denied pulmonary symptoms, asthma, pleuritic pain, productive sputum, cough, dyspnea and wheezing.  Gastrointestinal: Denied, gastro-esophageal reflux, melena, nausea and vomiting.  Genitourinary: Denied genitourinary symptoms including symptomatic vaginal discharge, pelvic relaxation issues, and urinary complaints.  Musculoskeletal: Denied musculoskeletal symptoms, stiffness, swelling, muscle weakness and myalgia.  Dermatologic: Denied dermatology symptoms, rash and scar.  Neurologic: Denied neurology symptoms, dizziness, headache, neck pain and syncope.  Psychiatric: Denied psychiatric symptoms, anxiety and depression.  Endocrine: See HPI for additional information.   Meds:   Current Outpatient Medications on File Prior to Visit  Medication Sig Dispense Refill   albuterol (VENTOLIN HFA) 108 (90 Base) MCG/ACT inhaler Inhale into the lungs every 6 (six) hours as needed.     meloxicam (MOBIC) 15 MG tablet Take 15 mg by mouth daily.     No current facility-administered medications on file prior to visit.      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 Premature ovarian failure   2. Postmenopausal hormone therapy        Plan:            1.  We have discussed premature ovarian failure in detail.  I have answered all of her questions.  2.  I have recommended HRT and we have  discussed this in some detail.  She will begin this immediately.  HRT I have discussed HRT with the patient in detail.  The risk/benefits of it were reviewed.  She understands that during menopause Estrogen decreases dramatically and that this results in an increased risk of cardiovascular disease as well as osteoporosis.  We have also discussed the fact that hot flashes often result from a decrease in Estrogen, and that by replacing Estrogen, they can often be alleviated.  We have discussed skin, vaginal and urinary tract changes that may also take place from this drop in Estrogen.  Emotional changes have also been linked to Estrogen and we have briefly discussed this.  The benefits of HRT including decrease in hot flashes, vaginal dryness, and osteoporosis were discussed.  The emotional benefit and a possible change in her cardiovascular risk profile was also reviewed.  The risks associated with Hormone Replacement Therapy were also reviewed.  The use of unopposed Estrogen and its relationship to endometrial cancer was discussed.  The addition of Progesterone and its beneficial effect on endometrial cancer was also noted.  The fact that there has been no consistent definitive studies showing an increase in breast cancer in women who use HRT was discussed with the patient.  The possible side effects including breast tenderness, fluid retention, mood changes and vaginal bleeding were discussed.  The patient was informed that this is an elective medication and that she may choose not to take Hormone Replacement Therapy.  Literature on HRT was made available, and I believe that after answering all of the patient's questions.  Special emphasis on the WHI study, as well as several studies since that pertaining to the risks and benefits of estrogen replacement therapy were compared.  The possible limitations of these studies were discussed including the age stratification of the WHI study.  The possible increased role  of Progesterone in these studies was discussed in detail.  Different types of hormone formulation and methods of taking hormone replacement therapy were discussed. Literature on HRT was made available, and I believe that after answering all of the patient's questions she has an adequate and informed understanding of the risks and benefits of HRT.  Orders No orders of the defined types were placed in this encounter.    Meds ordered this encounter  Medications   estradiol (ESTRACE) 2 MG tablet    Sig: Take 1 tablet (2 mg total) by mouth daily.    Dispense:  90 tablet    Refill:  1   medroxyPROGESTERone (PROVERA) 2.5 MG tablet    Sig: Take 1 tablet (2.5 mg total) by mouth daily.    Dispense:  90 tablet    Refill:  1      F/U  Return in about 3 months (around 10/28/2021). I spent 35 minutes involved in the care of this patient preparing to see the patient by obtaining and reviewing her medical history (including labs, imaging tests and prior procedures), documenting clinical information in the electronic health record (EHR), counseling and coordinating care plans, writing and sending prescriptions,  ordering tests or procedures and in direct communicating with the patient and medical staff discussing pertinent items from her history and physical exam.  Elonda Husky, M.D. 07/28/2021 1:57 PM

## 2021-07-28 NOTE — Progress Notes (Signed)
Patient presents today for lab work follow-up. She states still not having a cycle but states she can "smell that it should be happening". No other questions or concerns at this time.

## 2021-08-03 ENCOUNTER — Other Ambulatory Visit: Payer: Self-pay

## 2021-08-18 ENCOUNTER — Telehealth: Payer: Self-pay | Admitting: Obstetrics and Gynecology

## 2021-08-18 DIAGNOSIS — F411 Generalized anxiety disorder: Secondary | ICD-10-CM | POA: Diagnosis not present

## 2021-08-18 DIAGNOSIS — Z79899 Other long term (current) drug therapy: Secondary | ICD-10-CM | POA: Diagnosis not present

## 2021-08-18 DIAGNOSIS — F331 Major depressive disorder, recurrent, moderate: Secondary | ICD-10-CM | POA: Diagnosis not present

## 2021-08-18 NOTE — Telephone Encounter (Signed)
Pt states : changing pad every hour at times 2 x an 1hr .  Pt states she is using 2 pads at a time to catch the blood flow. Pt states this has been happening for 2 days. Severe cramps . Pt is asking for a call back.

## 2021-08-19 NOTE — Telephone Encounter (Signed)
Patient states having abnormal bleeding starting on 08/16/21 and is still on going. Patient states the bleeding is heavy with a lot of clots.  Patient states moderate abdominal pain and cramping. She states only new changes are the addition of daily estrogen and progesterone. Patient states experiencing weakness, lightheadedness and occasional dizziness. Advised patient per office protocol if she is soaking a pad within 30 minutes to an hour she should be evaluated at the ED. Advised patient to increase hydration, stay out of the heat and to go to the nearest ED if things get worse. She voiced understanding and that she would be in touch with any changes.

## 2021-08-22 NOTE — Telephone Encounter (Signed)
Sent MyChart message to check in.

## 2021-08-30 DIAGNOSIS — M2550 Pain in unspecified joint: Secondary | ICD-10-CM | POA: Diagnosis not present

## 2021-08-30 DIAGNOSIS — Z6841 Body Mass Index (BMI) 40.0 and over, adult: Secondary | ICD-10-CM | POA: Diagnosis not present

## 2021-08-30 DIAGNOSIS — E790 Hyperuricemia without signs of inflammatory arthritis and tophaceous disease: Secondary | ICD-10-CM | POA: Diagnosis not present

## 2021-08-30 DIAGNOSIS — F411 Generalized anxiety disorder: Secondary | ICD-10-CM | POA: Diagnosis not present

## 2021-08-30 DIAGNOSIS — G8929 Other chronic pain: Secondary | ICD-10-CM | POA: Diagnosis not present

## 2021-08-30 DIAGNOSIS — R52 Pain, unspecified: Secondary | ICD-10-CM | POA: Diagnosis not present

## 2021-08-30 DIAGNOSIS — J4520 Mild intermittent asthma, uncomplicated: Secondary | ICD-10-CM | POA: Diagnosis not present

## 2021-08-30 DIAGNOSIS — F321 Major depressive disorder, single episode, moderate: Secondary | ICD-10-CM | POA: Diagnosis not present

## 2021-09-03 ENCOUNTER — Encounter: Payer: Self-pay | Admitting: Intensive Care

## 2021-09-03 ENCOUNTER — Emergency Department
Admission: EM | Admit: 2021-09-03 | Discharge: 2021-09-03 | Disposition: A | Discharge status: Home / Self Care | Payer: Medicaid Other | Attending: Emergency Medicine | Admitting: Emergency Medicine

## 2021-09-03 ENCOUNTER — Other Ambulatory Visit: Payer: Self-pay

## 2021-09-03 ENCOUNTER — Emergency Department: Payer: Medicaid Other

## 2021-09-03 DIAGNOSIS — R109 Unspecified abdominal pain: Secondary | ICD-10-CM | POA: Diagnosis not present

## 2021-09-03 DIAGNOSIS — N83202 Unspecified ovarian cyst, left side: Secondary | ICD-10-CM | POA: Diagnosis not present

## 2021-09-03 DIAGNOSIS — R112 Nausea with vomiting, unspecified: Secondary | ICD-10-CM | POA: Insufficient documentation

## 2021-09-03 DIAGNOSIS — R1032 Left lower quadrant pain: Secondary | ICD-10-CM | POA: Diagnosis present

## 2021-09-03 DIAGNOSIS — K760 Fatty (change of) liver, not elsewhere classified: Secondary | ICD-10-CM | POA: Diagnosis not present

## 2021-09-03 DIAGNOSIS — Z8742 Personal history of other diseases of the female genital tract: Secondary | ICD-10-CM | POA: Diagnosis not present

## 2021-09-03 LAB — URINALYSIS, ROUTINE W REFLEX MICROSCOPIC
Bacteria, UA: NONE SEEN
Bilirubin Urine: NEGATIVE
Glucose, UA: NEGATIVE mg/dL
Ketones, ur: 5 mg/dL — AB
Leukocytes,Ua: NEGATIVE
Nitrite: NEGATIVE
Protein, ur: 100 mg/dL — AB
Specific Gravity, Urine: 1.025 (ref 1.005–1.030)
pH: 6 (ref 5.0–8.0)

## 2021-09-03 LAB — COMPREHENSIVE METABOLIC PANEL
ALT: 24 U/L (ref 0–44)
AST: 22 U/L (ref 15–41)
Albumin: 4.2 g/dL (ref 3.5–5.0)
Alkaline Phosphatase: 62 U/L (ref 38–126)
Anion gap: 10 (ref 5–15)
BUN: 21 mg/dL — ABNORMAL HIGH (ref 6–20)
CO2: 20 mmol/L — ABNORMAL LOW (ref 22–32)
Calcium: 8.6 mg/dL — ABNORMAL LOW (ref 8.9–10.3)
Chloride: 105 mmol/L (ref 98–111)
Creatinine, Ser: 1.04 mg/dL — ABNORMAL HIGH (ref 0.44–1.00)
GFR, Estimated: >60 mL/min (ref >60)
Glucose, Bld: 110 mg/dL — ABNORMAL HIGH (ref 70–99)
Potassium: 4.2 mmol/L (ref 3.5–5.1)
Sodium: 135 mmol/L (ref 135–145)
Total Bilirubin: 0.7 mg/dL (ref 0.3–1.2)
Total Protein: 8.2 g/dL — ABNORMAL HIGH (ref 6.5–8.1)

## 2021-09-03 LAB — CBC
HCT: 43.9 % (ref 36.0–46.0)
Hemoglobin: 14.6 g/dL (ref 12.0–15.0)
MCH: 28.8 pg (ref 26.0–34.0)
MCHC: 33.3 g/dL (ref 30.0–36.0)
MCV: 86.6 fL (ref 80.0–100.0)
Platelets: 340 10*3/uL (ref 150–400)
RBC: 5.07 MIL/uL (ref 3.87–5.11)
RDW: 13.6 % (ref 11.5–15.5)
WBC: 10 10*3/uL (ref 4.0–10.5)
nRBC: 0 % (ref 0.0–0.2)

## 2021-09-03 LAB — PREGNANCY, URINE: Preg Test, Ur: NEGATIVE

## 2021-09-03 LAB — LIPASE, BLOOD: Lipase: 27 U/L (ref 11–51)

## 2021-09-03 MED ORDER — ONDANSETRON HCL 4 MG/2ML IJ SOLN
4.0000 mg | Freq: Once | INTRAMUSCULAR | Status: AC
  Administered 2021-09-03: 4 mg via INTRAVENOUS
  Filled 2021-09-03: qty 2

## 2021-09-03 MED ORDER — MORPHINE SULFATE (PF) 4 MG/ML IV SOLN
4.0000 mg | Freq: Once | INTRAVENOUS | Status: AC
  Administered 2021-09-03: 4 mg via INTRAVENOUS
  Filled 2021-09-03: qty 1

## 2021-09-03 MED ORDER — ONDANSETRON 4 MG PO TBDP
4.0000 mg | ORAL_TABLET | Freq: Once | ORAL | Status: AC | PRN
  Administered 2021-09-03: 4 mg via ORAL
  Filled 2021-09-03: qty 1

## 2021-09-03 MED ORDER — IOHEXOL 300 MG/ML  SOLN
100.0000 mL | Freq: Once | INTRAMUSCULAR | Status: AC | PRN
  Administered 2021-09-03: 100 mL via INTRAVENOUS

## 2021-09-03 MED ORDER — SODIUM CHLORIDE 0.9 % IV BOLUS
1000.0000 mL | Freq: Once | INTRAVENOUS | Status: AC
  Administered 2021-09-03: 1000 mL via INTRAVENOUS

## 2021-09-03 MED ORDER — KETOROLAC TROMETHAMINE 30 MG/ML IJ SOLN
30.0000 mg | Freq: Once | INTRAMUSCULAR | Status: AC
  Administered 2021-09-03: 30 mg via INTRAVENOUS
  Filled 2021-09-03 (×2): qty 1

## 2021-09-03 MED ORDER — ONDANSETRON HCL 4 MG/2ML IJ SOLN
INTRAMUSCULAR | Status: AC
  Filled 2021-09-03: qty 2

## 2021-09-03 NOTE — ED Provider Notes (Signed)
[facility] Provider Note   Event Date/Time   First MD Initiated Contact with Patient 09/03/21 1258     (approximate) History  Abdominal Pain  HPI Caitlin Schneider is a 41 y.o. female who presents for left lower quadrant abdominal pain that she describes as 10/10, radiating up to the right flank, and not worsened with any known factors.  Patient states that she has been unable to get up or out of bed.  Patient does endorse associated nausea/vomiting.  Patient states that she is currently being seen and evaluated by OB/GYN who placed her on "a medication that may be ovulate and another medication that stopped me from ovulating".  Patient is very concerned that this may be causing her abdominal pain. ROS: Patient currently denies any vision changes, tinnitus, difficulty speaking, facial droop, sore throat, chest pain, shortness of breath, diarrhea, dysuria, or weakness/numbness/paresthesias in any extremity   Physical Exam  Triage Vital Signs: ED Triage Vitals  Enc Vitals Group     BP 09/03/21 1219 138/88     Pulse Rate 09/03/21 1219 84     Resp 09/03/21 1219 16     Temp 09/03/21 1219 98.4 F (36.9 C)     Temp Source 09/03/21 1219 Oral     SpO2 09/03/21 1219 94 %     Weight 09/03/21 1221 232 lb (105.2 kg)     Height 09/03/21 1221 5\' 2"  (1.575 m)     Head Circumference --      Peak Flow --      Pain Score 09/03/21 1220 10     Pain Loc --      Pain Edu? --      Excl. in GC? --    Most recent vital signs: Vitals:   09/03/21 1405 09/03/21 1706  BP: (!) 133/117 (!) 102/55  Pulse: 84 (!) 57  Resp: 19 17  Temp:  98 F (36.7 C)  SpO2: 98% 93%   General: Awake, oriented x4. CV:  Good peripheral perfusion.  Resp:  Normal effort.  Abd:  No distention.  Other:  Middle-aged obese Caucasian female laying in bed in distress secondary to pain ED Results / Procedures / Treatments  Labs (all labs ordered are listed, but only abnormal results are displayed) Labs  Reviewed  COMPREHENSIVE METABOLIC PANEL - Abnormal; Notable for the following components:      Result Value   CO2 20 (*)    Glucose, Bld 110 (*)    BUN 21 (*)    Creatinine, Ser 1.04 (*)    Calcium 8.6 (*)    Total Protein 8.2 (*)    All other components within normal limits  URINALYSIS, ROUTINE W REFLEX MICROSCOPIC - Abnormal; Notable for the following components:   Color, Urine YELLOW (*)    APPearance HAZY (*)    Hgb urine dipstick LARGE (*)    Ketones, ur 5 (*)    Protein, ur 100 (*)    All other components within normal limits  LIPASE, BLOOD  CBC  PREGNANCY, URINE  RADIOLOGY ED MD interpretation: CT of the abdomen and pelvis with IV contrast did not show any evidence of acute abnormalities.  This study was independently interpreted by me. -Agree with radiology assessment Official radiology report(s): CT Abdomen Pelvis W Contrast  Result Date: 09/03/2021 CLINICAL DATA:  41 year old female with acute abdominal and pelvic pain. EXAM: CT ABDOMEN AND PELVIS WITH CONTRAST TECHNIQUE: Multidetector CT imaging of the abdomen and pelvis was performed using the  standard protocol following bolus administration of intravenous contrast. RADIATION DOSE REDUCTION: This exam was performed according to the departmental dose-optimization program which includes automated exposure control, adjustment of the mA and/or kV according to patient size and/or use of iterative reconstruction technique. CONTRAST:  OMNIPAQUE IOHEXOL 300 MG/ML  SOLN COMPARISON:  03/29/2020 CT FINDINGS: Lower chest: No acute abnormality. Hepatobiliary: Hepatic steatosis identified without other significant [place] abnormality. The gallbladder is unremarkable. There is no evidence of cholelithiasis or acute cholecystitis. Pancreas: Unremarkable Spleen: Unremarkable Adrenals/Urinary Tract: The kidneys, adrenal glands and bladder are unremarkable. Stomach/Bowel: Stomach is within normal limits. Appendix appears normal. No evidence  of bowel wall thickening, distention, or inflammatory changes. Vascular/Lymphatic: No significant vascular findings are present. No enlarged abdominal or pelvic lymph nodes. Reproductive: Uterus and bilateral adnexa are unremarkable. Other: No ascites, focal collection or pneumoperitoneum. A small umbilical hernia containing fat is again noted. Musculoskeletal: No acute or suspicious bony abnormalities are noted. IMPRESSION: 1. No evidence of acute abnormality. 2. Hepatic steatosis. 3. Unchanged small umbilical hernia containing fat Electronically Signed   By: Harmon Pier M.D.   On: 09/03/2021 16:46   PROCEDURES: Critical Care performed: No Procedures MEDICATIONS ORDERED IN ED: Medications  ondansetron (ZOFRAN-ODT) disintegrating tablet 4 mg (4 mg Oral Given 09/03/21 1230)  sodium chloride 0.9 % bolus 1,000 mL (1,000 mLs Intravenous New Bag/Given 09/03/21 1447)  ketorolac (TORADOL) 30 MG/ML injection 30 mg (30 mg Intravenous Given 09/03/21 1447)  morphine (PF) 4 MG/ML injection 4 mg (4 mg Intravenous Given 09/03/21 1447)  ondansetron (ZOFRAN) injection 4 mg (4 mg Intravenous Given 09/03/21 1501)  iohexol (OMNIPAQUE) 300 MG/ML solution 100 mL (100 mLs Intravenous Contrast Given 09/03/21 1613)   IMPRESSION / MDM / ASSESSMENT AND PLAN / ED COURSE  I reviewed the triage vital signs and the nursing notes.                             The patient is on the cardiac monitor to evaluate for evidence of arrhythmia and/or significant heart rate changes. Patient's presentation is most consistent with acute presentation with potential threat to life or bodily function. Patients symptoms not typical for emergent causes of abdominal pain such as, but not limited to, appendicitis, abdominal aortic aneurysm, surgical biliary disease, pancreatitis, SBO, mesenteric ischemia, serious intra-abdominal bacterial illness. Presentation also not typical of gynecologic emergencies such as TOA, Ovarian Torsion, PID. Not  Ectopic. Doubt atypical ACS.  Pt tolerating PO. Disposition: Patient will be discharged with strict return precautions and follow up with primary MD within 12-24 hours for further evaluation. Patient understands that this still may have an early presentation of an emergent medical condition such as appendicitis that will require a recheck.   FINAL CLINICAL IMPRESSION(S) / ED DIAGNOSES   Final diagnoses:  Left lower quadrant abdominal pain  History of ovarian cyst   Rx / DC Orders   ED Discharge Orders     None      Note:  This document was prepared using Dragon voice recognition software and may include unintentional dictation errors.   Merwyn Katos, MD 09/03/21 1745

## 2021-09-03 NOTE — ED Triage Notes (Signed)
Patient c/o pelvic pain. Patient yelling out and grabbing abdomen. Reports she has been on period for 2 weeks. Reports PCP gave her hormone pills X3-4 weeks ago that she has been taking

## 2021-09-03 NOTE — Discharge Instructions (Signed)
Please use ibuprofen (Motrin) up to 800 mg every 8 hours, naproxen (Naprosyn) up to 500 mg every 12 hours, and/or acetaminophen (Tylenol) up to 4 g/day for any continued pain 

## 2021-09-03 NOTE — ED Notes (Signed)
Patient ambulated to hallway bathroom with a steady gait. 

## 2021-09-08 DIAGNOSIS — F411 Generalized anxiety disorder: Secondary | ICD-10-CM | POA: Diagnosis not present

## 2021-09-15 DIAGNOSIS — F411 Generalized anxiety disorder: Secondary | ICD-10-CM | POA: Diagnosis not present

## 2021-09-15 DIAGNOSIS — F331 Major depressive disorder, recurrent, moderate: Secondary | ICD-10-CM | POA: Diagnosis not present

## 2021-10-13 DIAGNOSIS — F331 Major depressive disorder, recurrent, moderate: Secondary | ICD-10-CM | POA: Diagnosis not present

## 2021-10-13 DIAGNOSIS — F411 Generalized anxiety disorder: Secondary | ICD-10-CM | POA: Diagnosis not present

## 2021-10-26 ENCOUNTER — Ambulatory Visit: Payer: Medicaid Other | Admitting: Obstetrics and Gynecology

## 2021-10-26 ENCOUNTER — Encounter: Payer: Self-pay | Admitting: Obstetrics and Gynecology

## 2021-10-26 VITALS — BP 96/67 | HR 57 | Ht 62.0 in | Wt 235.7 lb

## 2021-10-26 DIAGNOSIS — Z79890 Hormone replacement therapy (postmenopausal): Secondary | ICD-10-CM

## 2021-10-26 MED ORDER — MEDROXYPROGESTERONE ACETATE 5 MG PO TABS: 5.0000 mg | ORAL_TABLET | Freq: Every day | ORAL | 1 refills | Status: DC

## 2021-10-26 NOTE — Progress Notes (Signed)
HPI:      Caitlin Schneider is a 41 y.o. [phone] who LMP was No LMP recorded. Patient is perimenopausal.  Subjective:   She presents today for 50-month follow-up after being found to have elevated FSH.  She was started on HRT.  She has had irregular and sometimes heavy bleeding off and on during most of those 3 months.    Hx: The following portions of the patient's history were reviewed and updated as appropriate:             She  has a past medical history of Anxiety, Asthma, Depression, IV drug user, and Substance abuse (HCC). She does not have any pertinent problems on file. She  has a past surgical history that includes Cesarean section and Incision and drainage abscess (Left, 05/31/2016). Her family history includes Breast cancer in her cousin; COPD in her mother; Diabetes in her father and paternal grandmother; Gout in her brother; Kidney disease in her father; Lung cancer in her maternal uncle; Pancreatic cancer in her maternal uncle. She  reports that she quit smoking about 6 months ago. Her smoking use included cigarettes. She smoked an average of .5 packs per day. She has never used smokeless tobacco. She reports current drug use. Drugs: IV, Cocaine, Methamphetamines, and Marijuana. She reports that she does not drink alcohol. She has a current medication list which includes the following prescription(s): albuterol, estradiol, medroxyprogesterone, and meloxicam. She has No Known Allergies.       Review of Systems:  Review of Systems  Constitutional: Denied constitutional symptoms, night sweats, recent illness, fatigue, fever, insomnia and weight loss.  Eyes: Denied eye symptoms, eye pain, photophobia, vision change and visual disturbance.  Ears/Nose/Throat/Neck: Denied ear, nose, throat or neck symptoms, hearing loss, nasal discharge, sinus congestion and sore throat.  Cardiovascular: Denied cardiovascular symptoms, arrhythmia, chest pain/pressure, edema, exercise intolerance, orthopnea  and palpitations.  Respiratory: Denied pulmonary symptoms, asthma, pleuritic pain, productive sputum, cough, dyspnea and wheezing.  Gastrointestinal: Denied, gastro-esophageal reflux, melena, nausea and vomiting.  Genitourinary: See HPI  Musculoskeletal: Denied musculoskeletal symptoms, stiffness, swelling, muscle weakness and myalgia.  Dermatologic: Denied dermatology symptoms, rash and scar.  Neurologic: Denied neurology symptoms, dizziness, headache, neck pain and syncope.  Psychiatric: Denied psychiatric symptoms, anxiety and depression.  Endocrine: Denied endocrine symptoms including hot flashes and night sweats.   Meds:   Current Outpatient Medications on File Prior to Visit  Medication Sig Dispense Refill   albuterol (VENTOLIN HFA) 108 (90 Base) MCG/ACT inhaler Inhale into the lungs every 6 (six) hours as needed.     estradiol (ESTRACE) 2 MG tablet Take 1 tablet (2 mg total) by mouth daily. 90 tablet 1   meloxicam (MOBIC) 15 MG tablet Take 15 mg by mouth daily.     No current facility-administered medications on file prior to visit.      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 Postmenopausal hormone therapy  Patient is having breakthrough bleeding on HRT.   Plan:            1.  We discussed 2 different options.  Option #1 would be to increase her Provera to 5 mg daily in an attempt to prevent bleeding.  Second option would be to start a Seasonique type OCP for better cycle control. Patient would like to simply increase her Provera and try that.  I have asked her to touch base with [place] in 4  weeks. Orders No orders of the defined types were placed in this encounter.    Meds ordered this encounter  Medications   medroxyPROGESTERone (PROVERA) 5 MG tablet    Sig: Take 1 tablet (5 mg total) by mouth daily.    Dispense:  90 tablet    Refill:  1      F/U  Return in about 2 months (around 12/26/2021). I spent 20 minutes involved in the care of this patient preparing to see the patient by obtaining and reviewing her medical history (including labs, imaging tests and prior procedures), documenting clinical information in the electronic health record (EHR), counseling and coordinating care plans, writing and sending prescriptions, ordering tests or procedures and in direct communicating with the patient and medical staff discussing pertinent items from her history and physical exam.  Finis Bud, M.D. 10/26/2021 9:33 AM

## 2021-10-26 NOTE — Progress Notes (Signed)
Patient presents today for a 3 month follow-up. She states since being on HRT she had a month long period of heavy bleeding which is now light bleeding every two weeks. She states no additional concerns at this time.

## 2021-11-16 DIAGNOSIS — F331 Major depressive disorder, recurrent, moderate: Secondary | ICD-10-CM | POA: Diagnosis not present

## 2021-11-23 ENCOUNTER — Other Ambulatory Visit: Payer: Self-pay

## 2021-12-19 ENCOUNTER — Other Ambulatory Visit: Payer: Self-pay

## 2021-12-19 DIAGNOSIS — E2839 Other primary ovarian failure: Secondary | ICD-10-CM

## 2021-12-19 MED ORDER — ESTRADIOL 2 MG PO TABS
2.0000 mg | ORAL_TABLET | Freq: Every day | ORAL | 1 refills | Status: DC
  Filled 2021-12-19: qty 90, 90d supply, fill #0

## 2021-12-28 ENCOUNTER — Other Ambulatory Visit: Payer: Self-pay

## 2022-01-05 DIAGNOSIS — F331 Major depressive disorder, recurrent, moderate: Secondary | ICD-10-CM | POA: Diagnosis not present

## 2022-01-05 DIAGNOSIS — F411 Generalized anxiety disorder: Secondary | ICD-10-CM | POA: Diagnosis not present

## 2022-01-17 DIAGNOSIS — R52 Pain, unspecified: Secondary | ICD-10-CM | POA: Diagnosis not present

## 2022-01-17 DIAGNOSIS — Z23 Encounter for immunization: Secondary | ICD-10-CM | POA: Diagnosis not present

## 2022-01-17 DIAGNOSIS — Z6841 Body Mass Index (BMI) 40.0 and over, adult: Secondary | ICD-10-CM | POA: Diagnosis not present

## 2022-01-17 DIAGNOSIS — M2550 Pain in unspecified joint: Secondary | ICD-10-CM | POA: Diagnosis not present

## 2022-01-17 DIAGNOSIS — G8929 Other chronic pain: Secondary | ICD-10-CM | POA: Diagnosis not present

## 2022-01-17 DIAGNOSIS — M25551 Pain in right hip: Secondary | ICD-10-CM | POA: Diagnosis not present

## 2022-01-17 DIAGNOSIS — M25552 Pain in left hip: Secondary | ICD-10-CM | POA: Diagnosis not present

## 2022-01-17 DIAGNOSIS — F411 Generalized anxiety disorder: Secondary | ICD-10-CM | POA: Diagnosis not present

## 2022-01-19 DIAGNOSIS — F331 Major depressive disorder, recurrent, moderate: Secondary | ICD-10-CM | POA: Diagnosis not present

## 2022-01-24 DIAGNOSIS — M25551 Pain in right hip: Secondary | ICD-10-CM | POA: Diagnosis not present

## 2022-01-24 DIAGNOSIS — M25552 Pain in left hip: Secondary | ICD-10-CM | POA: Diagnosis not present

## 2022-02-16 DIAGNOSIS — F331 Major depressive disorder, recurrent, moderate: Secondary | ICD-10-CM | POA: Diagnosis not present

## 2022-03-07 DIAGNOSIS — F331 Major depressive disorder, recurrent, moderate: Secondary | ICD-10-CM | POA: Diagnosis not present

## 2022-03-15 ENCOUNTER — Other Ambulatory Visit: Payer: Self-pay | Admitting: Obstetrics and Gynecology

## 2022-03-15 DIAGNOSIS — Z79890 Hormone replacement therapy (postmenopausal): Secondary | ICD-10-CM

## 2022-03-15 DIAGNOSIS — E2839 Other primary ovarian failure: Secondary | ICD-10-CM

## 2022-03-23 DIAGNOSIS — F331 Major depressive disorder, recurrent, moderate: Secondary | ICD-10-CM | POA: Diagnosis not present

## 2022-04-06 DIAGNOSIS — F331 Major depressive disorder, recurrent, moderate: Secondary | ICD-10-CM | POA: Diagnosis not present

## 2022-04-12 DIAGNOSIS — F411 Generalized anxiety disorder: Secondary | ICD-10-CM | POA: Diagnosis not present

## 2022-04-12 DIAGNOSIS — F331 Major depressive disorder, recurrent, moderate: Secondary | ICD-10-CM | POA: Diagnosis not present

## 2022-04-12 IMAGING — US US PELVIS COMPLETE WITH TRANSVAGINAL
1 series · 15 of 25 positions shown · non-contrast
Comparison: December 26, 2017

CLINICAL DATA: Left lower quadrant pain x1 day.

EXAM:
TRANSABDOMINAL AND TRANSVAGINAL ULTRASOUND OF PELVIS
TECHNIQUE: Both transabdominal and transvaginal ultrasound examinations of the
pelvis were performed. Transabdominal technique was performed for
global imaging of the pelvis including uterus, ovaries, adnexal
regions, and pelvic cul-de-sac. It was necessary to proceed with
endovaginal exam following the transabdominal exam to visualize the
endometrium and bilateral ovaries.

[Series 1: us pelvis complete · 15 of 95 slices shown]
[im 1/95]
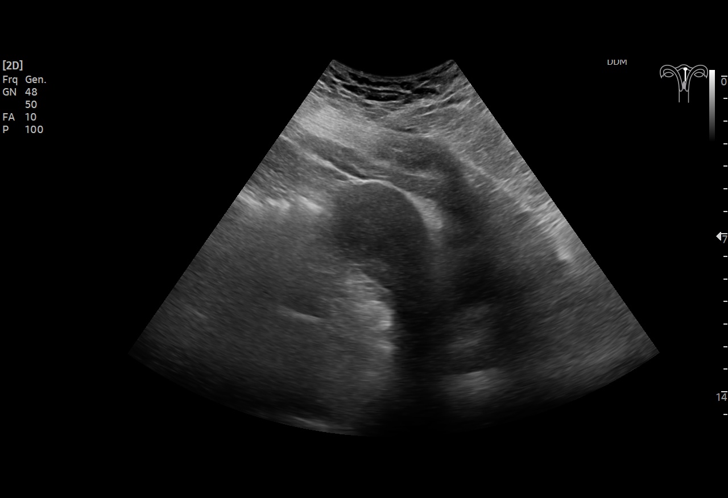
[im 8/95]
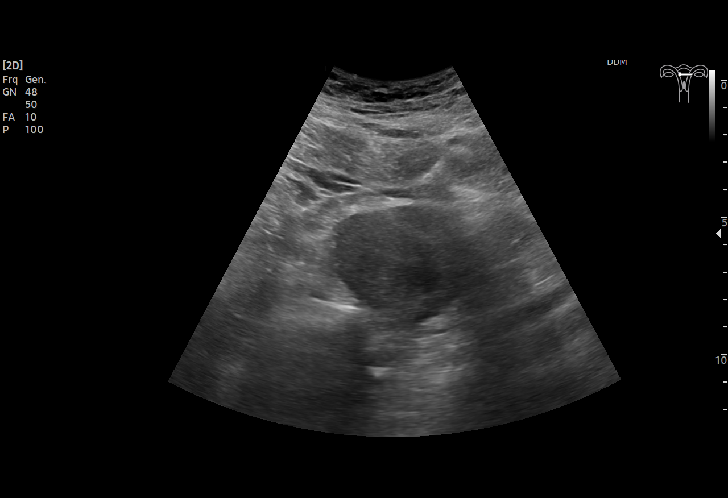
[im 16/95]
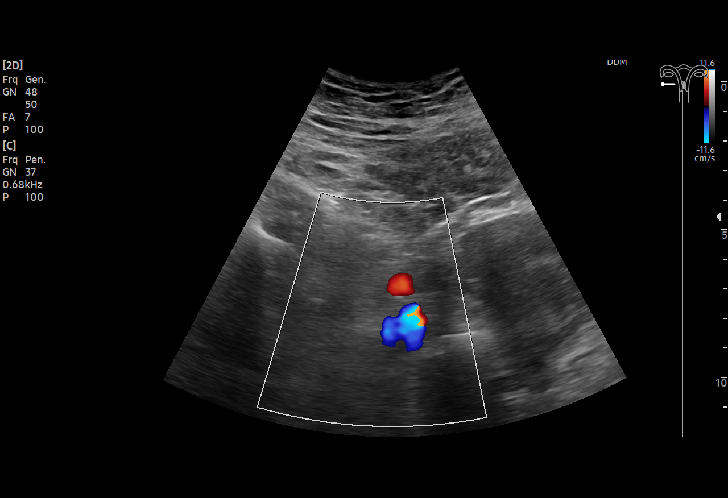
[im 20/95]
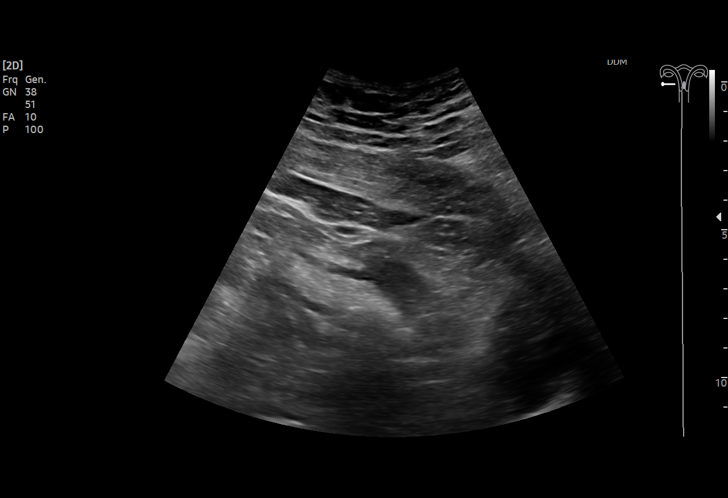
[im 28/95]
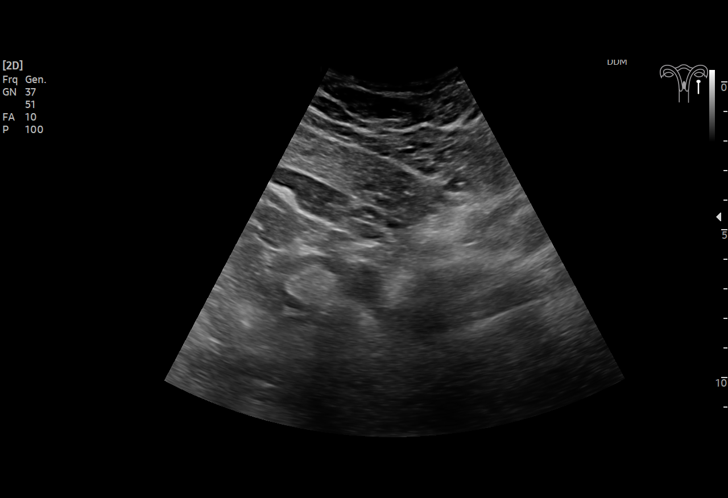
[im 36/95]
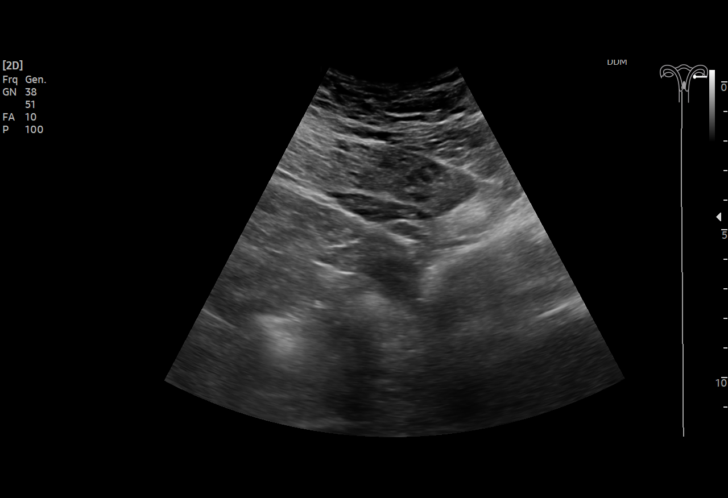
[im 40/95]
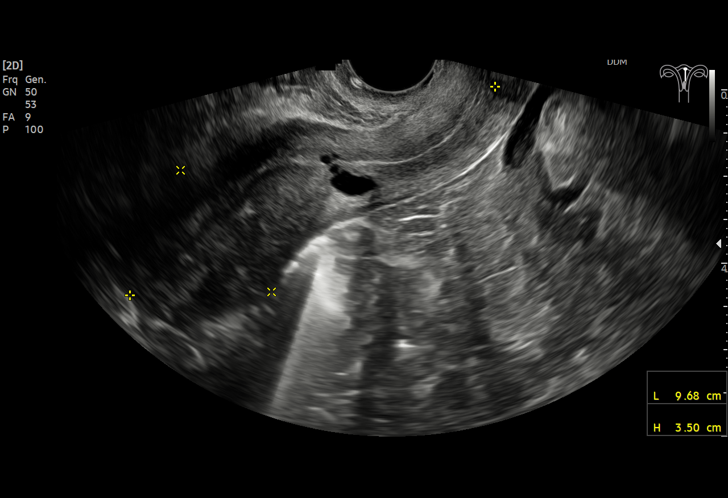
[im 48/95]
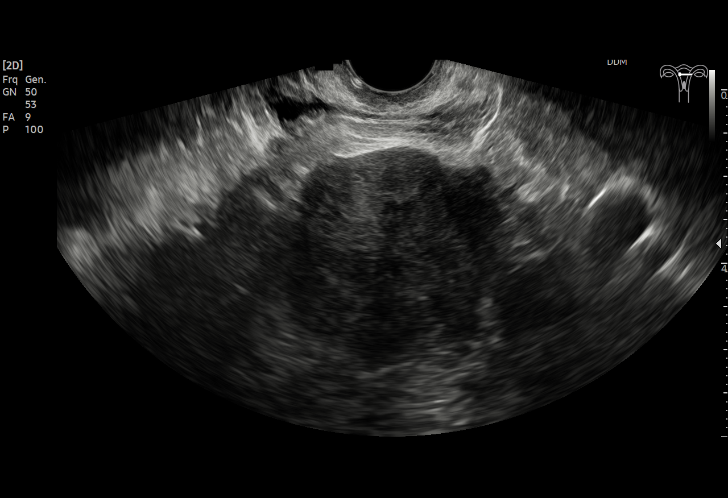
[im 55/95]
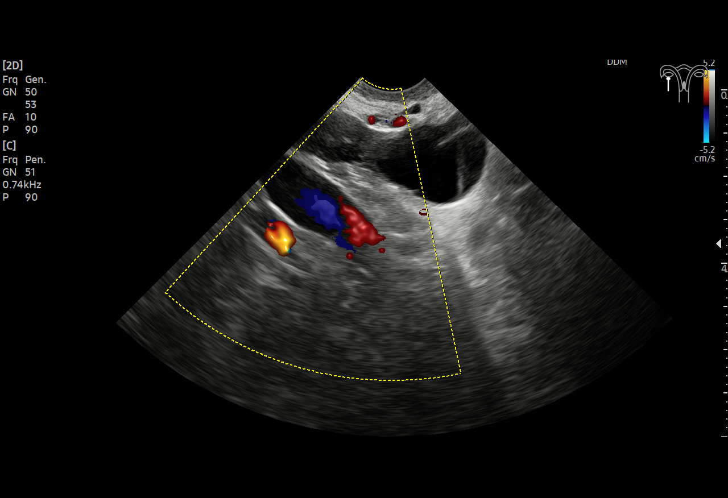
[im 59/95]
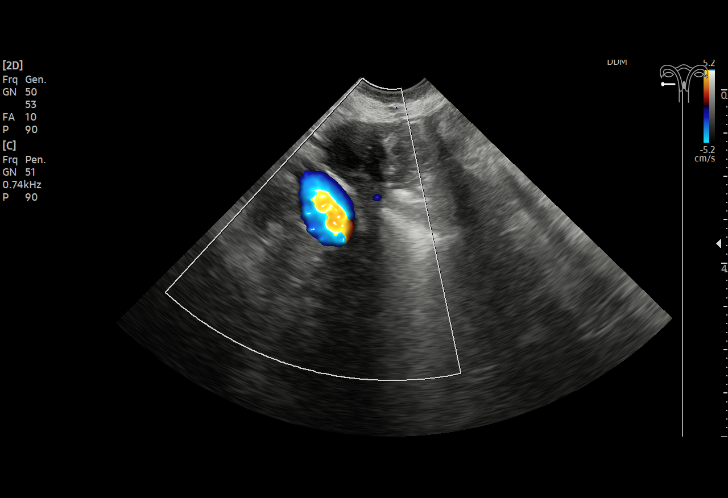
[im 67/95]
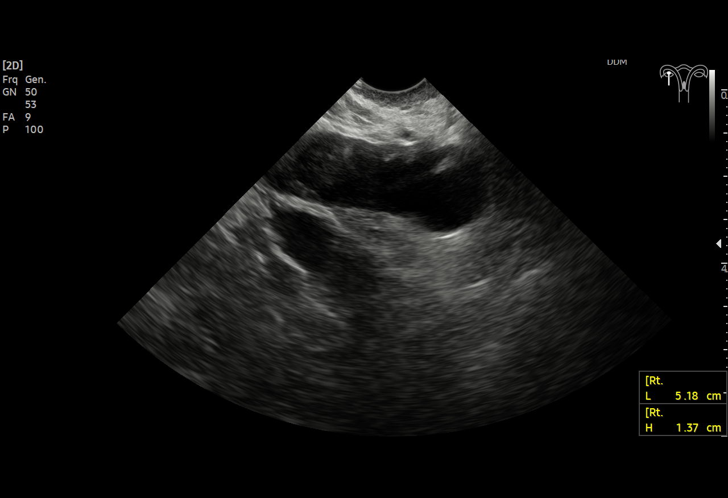
[im 75/95]
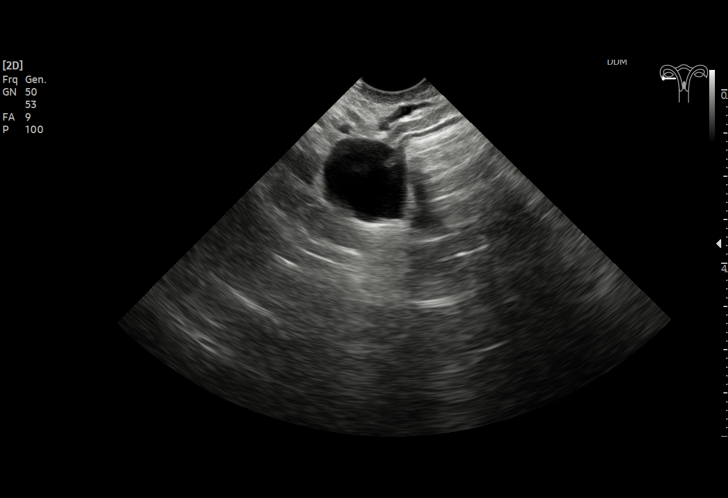
[im 79/95]
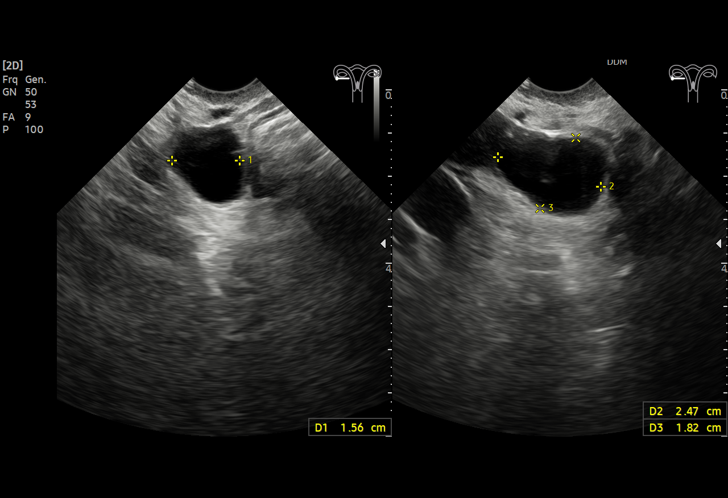
[im 87/95]
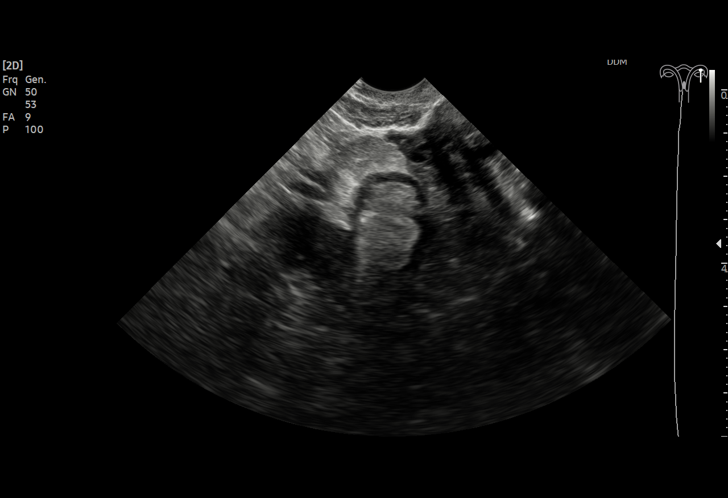
[im 95/95]
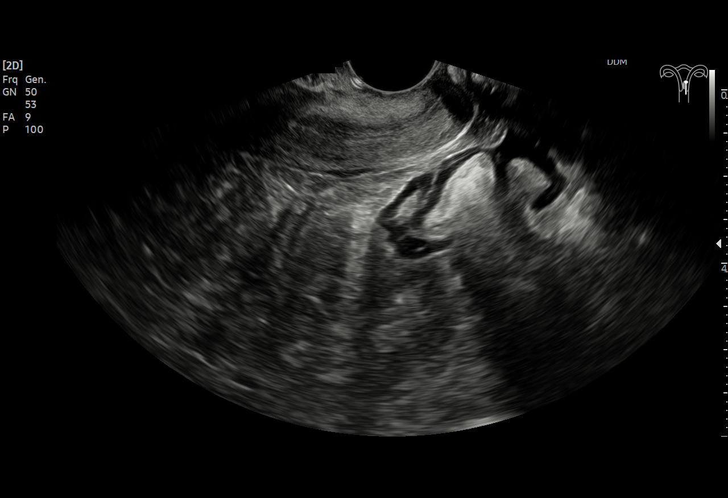

[15 of 25 positions shown; findings below may reference images not displayed]

FINDINGS: Uterus

Measurements: 9.7 cm x 3.5 cm x 4.6 cm = volume: 82.2 mL. No
fibroids or other mass visualized.

Endometrium

Thickness: 3.5 mm.  No focal abnormality visualized.

Right ovary

Measurements: 5.2 cm x 1.4 cm x 1.6 cm = volume: 5.9 mL. A 1.6 cm x
2.5 cm x 1.8 cm anechoic structure is seen within the right ovary.
No abnormal flow is seen within this region on color Doppler
evaluation.

Left ovary

Measurements: 2.0 cm x 2.0 cm x 1.6 cm = volume: 3.3 mL. Normal
appearance/no adnexal mass.

Other findings

No abnormal free fluid.
IMPRESSION: 1. Right ovarian cyst. No follow-up imaging is recommended. Note:
This recommendation does not apply to premenarchal patients or to
those with increased risk (genetic, family history, elevated tumor
markers or other high-risk factors) of ovarian cancer. Reference:
Radiology [DATE]):[id number].

## 2022-04-12 IMAGING — CT CT RENAL STONE PROTOCOL
3 of 4 series · 9 of 46 positions shown, 16 images · non-contrast
Comparison: None.

CLINICAL DATA: Left lower back pain radiating into the left lower
quadrant.

EXAM:
CT ABDOMEN AND PELVIS WITHOUT CONTRAST
TECHNIQUE: Multidetector CT imaging of the abdomen and pelvis was performed
following the standard protocol without IV contrast.

[Series 4: lung bases · axial · 0.78mm/px · z∈[-211,-116]mm · 5 of 29 slices shown, 10 images]
[im 5/29  soft-tissue]
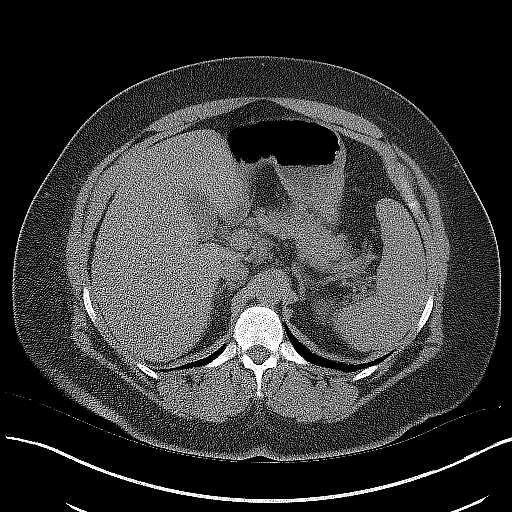
[im 5/29  bone]
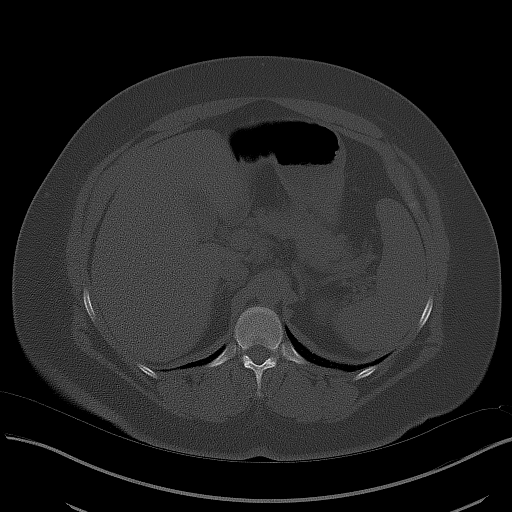
[im 10/29  soft-tissue]
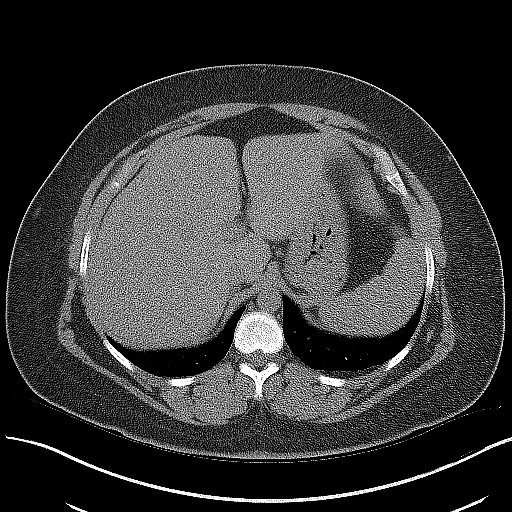
[im 10/29  lung]
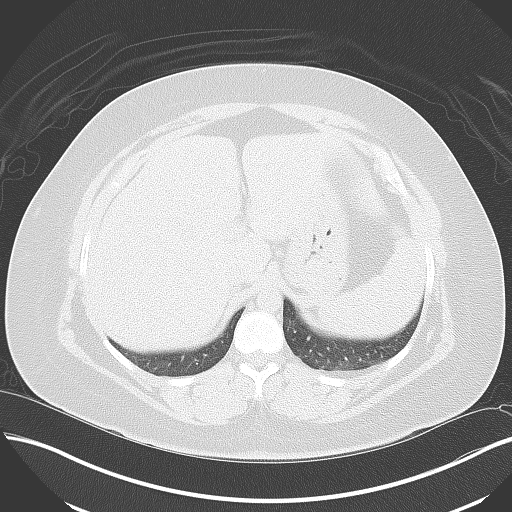
[im 15/29  soft-tissue]
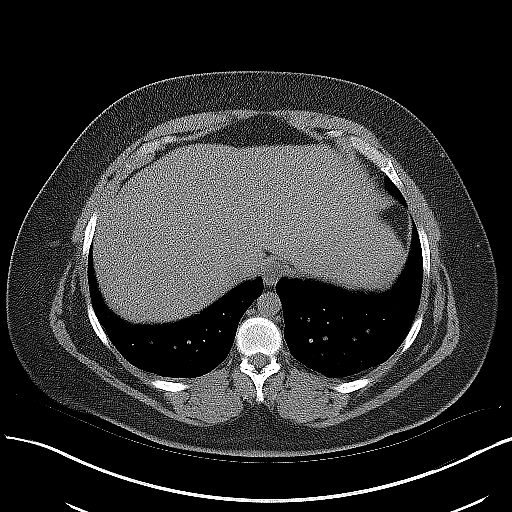
[im 15/29  lung]
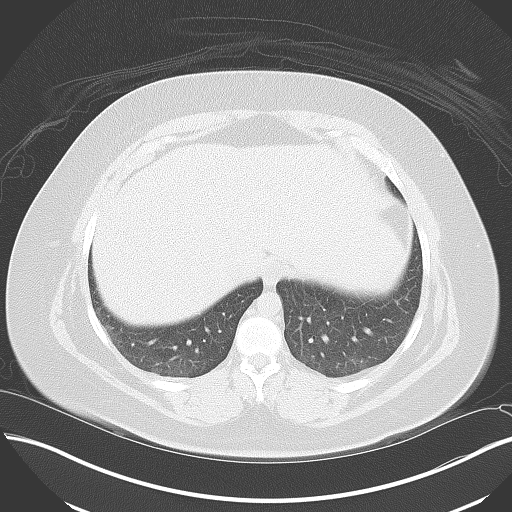
[im 19/29  soft-tissue]
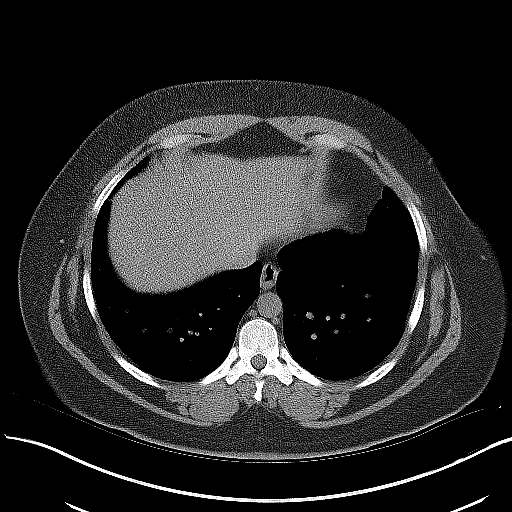
[im 19/29  lung]
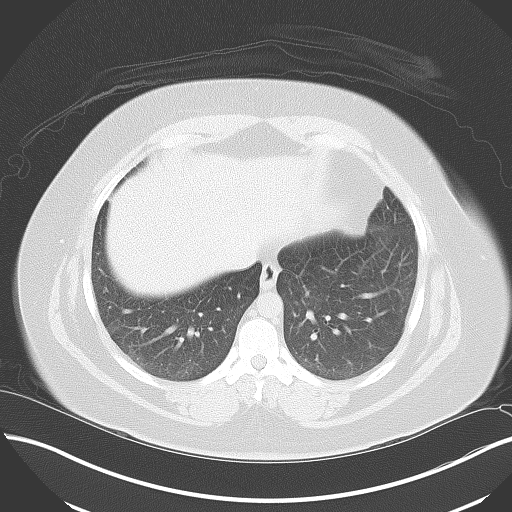
[im 24/29  soft-tissue]
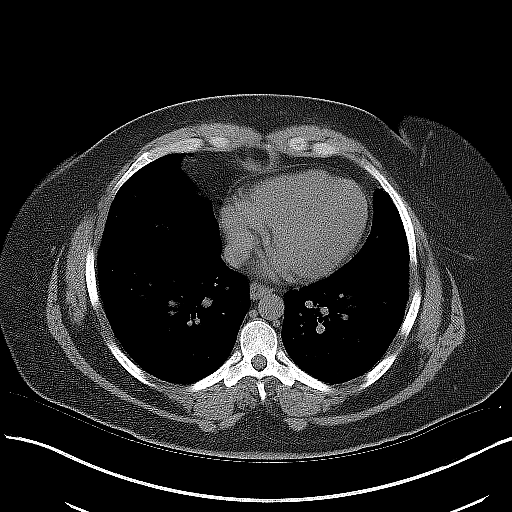
[im 24/29  lung]
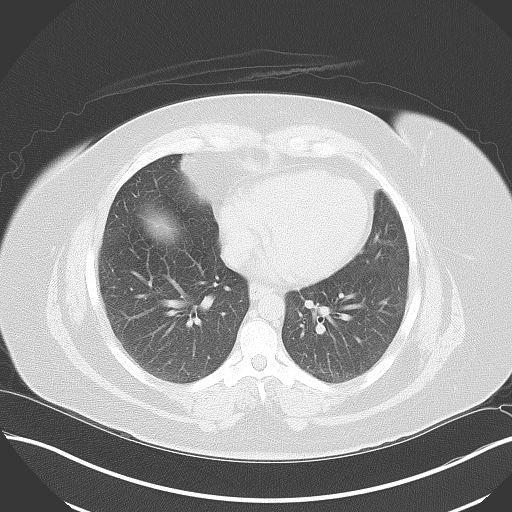

[Series 5: coronal · coronal · 0.73mm/px · 3 of 151 slices shown, 4 images]
[im 51/151  soft-tissue]
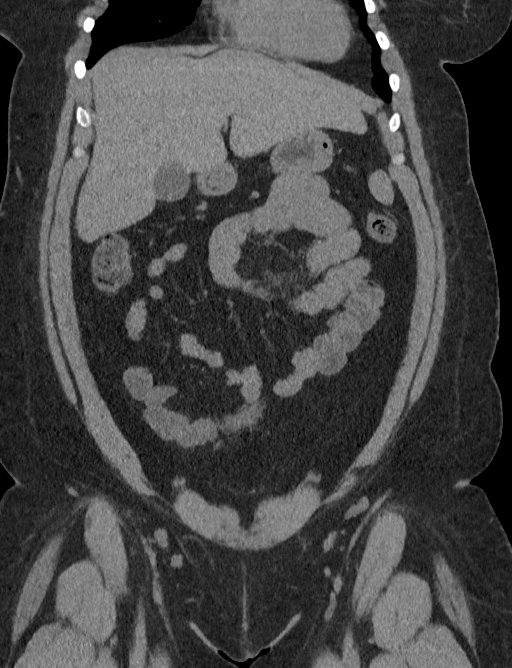
[im 67/151  soft-tissue]
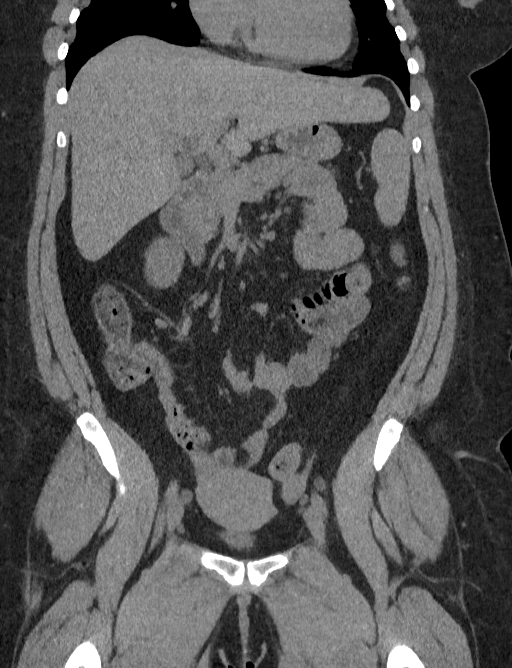
[im 67/151  bone]
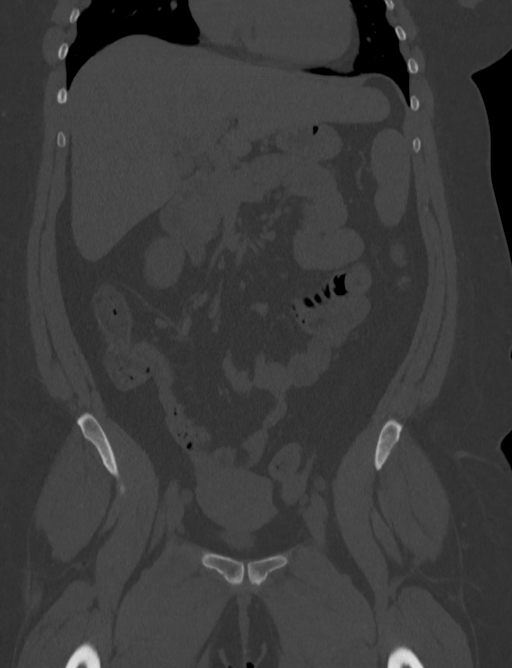
[im 84/151  soft-tissue]
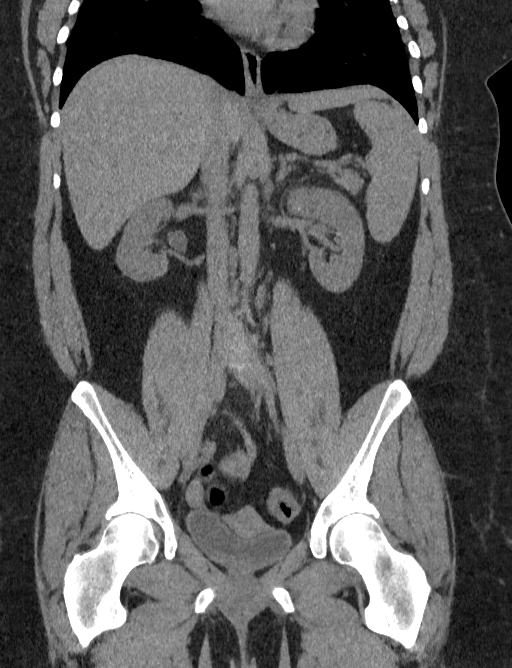

[Series 6: sagittal · sagittal · 0.59mm/px · 1 of 188 slices shown, 2 images]
[im 63/188  soft-tissue]
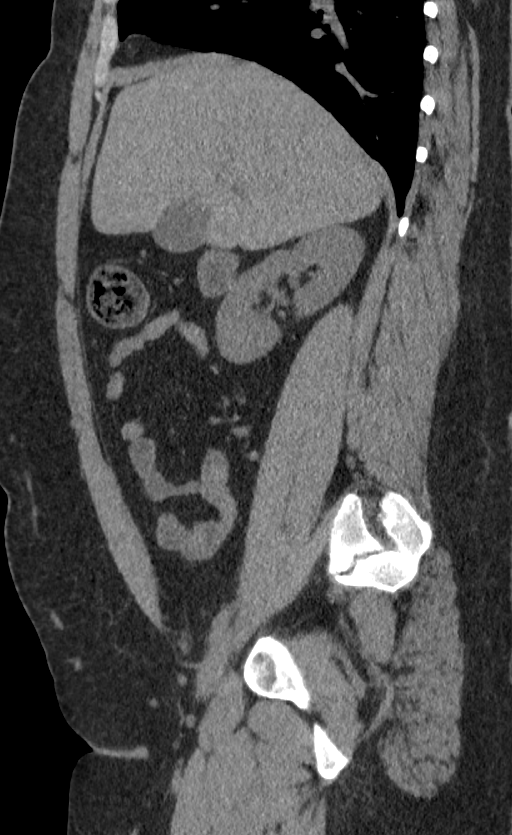
[im 63/188  bone]
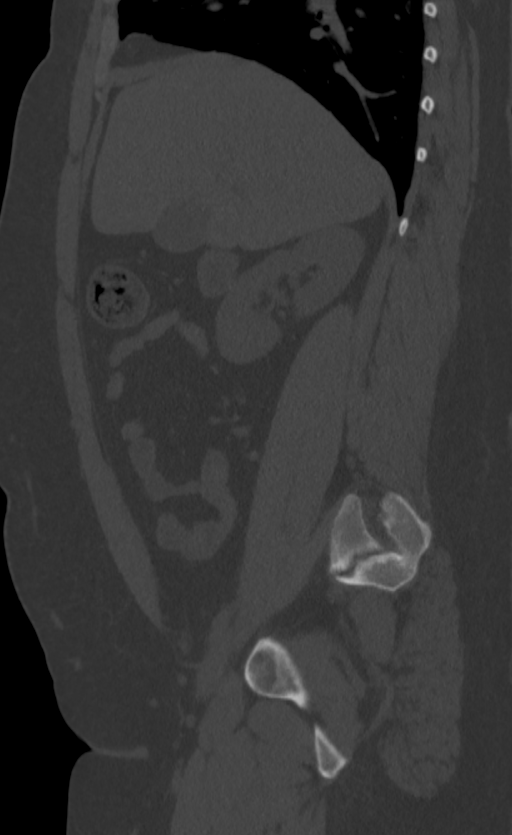

[9 of 46 positions shown; findings below may reference images not displayed]

FINDINGS: Lower chest: Unremarkable.

Hepatobiliary: No focal abnormality in the liver on this study
without intravenous contrast. There is no evidence for gallstones,
gallbladder wall thickening, or pericholecystic fluid. No
intrahepatic or extrahepatic biliary dilation.

Pancreas: No focal mass lesion. No dilatation of the main duct. No
intraparenchymal cyst. No peripancreatic edema.

Spleen: No splenomegaly. No focal mass lesion.

Adrenals/Urinary Tract: No adrenal nodule or mass. No stones in
either kidney or ureter. No secondary changes in either kidney or
ureter. No bladder stones.

Stomach/Bowel: Stomach is unremarkable. No gastric wall thickening.
No evidence of outlet obstruction. Duodenum is normally positioned
as is the ligament of Treitz. No small bowel wall thickening. No
small bowel dilatation. The terminal ileum is normal. The appendix
is normal. No gross colonic mass. No colonic wall thickening.

Vascular/Lymphatic: No abdominal aortic aneurysm. There is no
gastrohepatic or hepatoduodenal ligament lymphadenopathy. No
retroperitoneal or mesenteric lymphadenopathy. No pelvic sidewall
lymphadenopathy.

Reproductive: The uterus is unremarkable.  There is no adnexal mass.

Other: No intraperitoneal free fluid.

Musculoskeletal: No worrisome lytic or sclerotic osseous
abnormality.
IMPRESSION: No acute findings in the abdomen or pelvis. Specifically, no
findings to explain the patient's history of left lower quadrant
pain. No urinary stone disease or secondary changes in either kidney
or ureter.

## 2022-04-13 ENCOUNTER — Other Ambulatory Visit: Payer: Self-pay | Admitting: Obstetrics and Gynecology

## 2022-04-13 DIAGNOSIS — E2839 Other primary ovarian failure: Secondary | ICD-10-CM

## 2022-04-18 DIAGNOSIS — M5412 Radiculopathy, cervical region: Secondary | ICD-10-CM | POA: Diagnosis not present

## 2022-04-18 DIAGNOSIS — L600 Ingrowing nail: Secondary | ICD-10-CM | POA: Diagnosis not present

## 2022-04-18 DIAGNOSIS — J4520 Mild intermittent asthma, uncomplicated: Secondary | ICD-10-CM | POA: Diagnosis not present

## 2022-04-18 DIAGNOSIS — G8929 Other chronic pain: Secondary | ICD-10-CM | POA: Diagnosis not present

## 2022-04-18 DIAGNOSIS — M25562 Pain in left knee: Secondary | ICD-10-CM | POA: Diagnosis not present

## 2022-04-18 DIAGNOSIS — M542 Cervicalgia: Secondary | ICD-10-CM | POA: Diagnosis not present

## 2022-04-27 DIAGNOSIS — F331 Major depressive disorder, recurrent, moderate: Secondary | ICD-10-CM | POA: Diagnosis not present

## 2022-05-02 ENCOUNTER — Other Ambulatory Visit: Payer: Self-pay | Admitting: Physical Medicine & Rehabilitation

## 2022-05-02 DIAGNOSIS — M542 Cervicalgia: Secondary | ICD-10-CM | POA: Diagnosis not present

## 2022-05-02 DIAGNOSIS — M5412 Radiculopathy, cervical region: Secondary | ICD-10-CM | POA: Diagnosis not present

## 2022-05-11 DIAGNOSIS — F411 Generalized anxiety disorder: Secondary | ICD-10-CM | POA: Diagnosis not present

## 2022-05-11 DIAGNOSIS — F331 Major depressive disorder, recurrent, moderate: Secondary | ICD-10-CM | POA: Diagnosis not present

## 2022-05-15 ENCOUNTER — Ambulatory Visit
Admission: RE | Admit: 2022-05-15 | Discharge: 2022-05-15 | Disposition: A | Discharge status: Home / Self Care | Payer: Medicaid Other | Source: Ambulatory Visit | Attending: Physical Medicine & Rehabilitation | Admitting: Physical Medicine & Rehabilitation

## 2022-05-15 DIAGNOSIS — M4802 Spinal stenosis, cervical region: Secondary | ICD-10-CM | POA: Diagnosis not present

## 2022-05-15 DIAGNOSIS — M542 Cervicalgia: Secondary | ICD-10-CM | POA: Diagnosis not present

## 2022-05-17 ENCOUNTER — Ambulatory Visit: Payer: Medicaid Other | Admitting: Obstetrics and Gynecology

## 2022-05-17 ENCOUNTER — Encounter: Payer: Self-pay | Admitting: Obstetrics and Gynecology

## 2022-05-17 VITALS — BP 105/72 | HR 60 | Ht 62.0 in | Wt 222.3 lb

## 2022-05-17 DIAGNOSIS — Z79890 Hormone replacement therapy (postmenopausal): Secondary | ICD-10-CM | POA: Diagnosis not present

## 2022-05-17 NOTE — Progress Notes (Signed)
Patient presents today to follow-up on HRT medications. She states continuing to take her medication and having regular monthly cycles that are much better than before.

## 2022-05-17 NOTE — Progress Notes (Signed)
HPI:      [name] is a 42 y.o. [phone] who LMP was Patient's last menstrual period was 04/27/2022 (approximate).  Subjective:   She presents today 3 months late for her HRT follow-up.  She reports that she is doing well on estrogen and progesterone for menopause.  She was found to have an elevated FSH and was not having menses.  She is in remains somewhat disappointed in not being able to have children.  She does report that while on [facility] and LH she is now having many fewer hot flashes and feeling much better.  She does state that she is still having a menstrual period each month and remains hopeful that this means she could become pregnant.  We have discussed this at some length.  She is not currently sexually active and says that she broke up with her previous partner.    Hx: The following portions of the patient's history were reviewed and updated as appropriate:             She  has a past medical history of Anxiety, Asthma, Depression, IV drug user, and Substance abuse. She does not have any pertinent problems on file. She  has a past surgical history that includes Cesarean section and Incision and drainage abscess (Left, 05/31/2016). Her family history includes Breast cancer in her cousin; COPD in her mother; Diabetes in her father and paternal grandmother; Gout in her brother; Kidney disease in her father; Lung cancer in her maternal uncle; Pancreatic cancer in her maternal uncle. She  reports that she quit smoking about 13 months ago. Her smoking use included cigarettes. She smoked an average of .5 packs per day. She has never used smokeless tobacco. She reports current drug use. Drugs: IV, Cocaine, Methamphetamines, and Marijuana. She reports that she does not drink alcohol. She has a current medication list which includes the following prescription(s): albuterol, estradiol, medroxyprogesterone, and meloxicam. She has No Known Allergies.       Review of Systems:  Review of  Systems  Constitutional: Denied constitutional symptoms, night sweats, recent illness, fatigue, fever, insomnia and weight loss.  Eyes: Denied eye symptoms, eye pain, photophobia, vision change and visual disturbance.  Ears/Nose/Throat/Neck: Denied ear, nose, throat or neck symptoms, hearing loss, nasal discharge, sinus congestion and sore throat.  Cardiovascular: Denied cardiovascular symptoms, arrhythmia, chest pain/pressure, edema, exercise intolerance, orthopnea and palpitations.  Respiratory: Denied pulmonary symptoms, asthma, pleuritic pain, productive sputum, cough, dyspnea and wheezing.  Gastrointestinal: Denied, gastro-esophageal reflux, melena, nausea and vomiting.  Genitourinary: Denied genitourinary symptoms including symptomatic vaginal discharge, pelvic relaxation issues, and urinary complaints.  Musculoskeletal: Denied musculoskeletal symptoms, stiffness, swelling, muscle weakness and myalgia.  Dermatologic: Denied dermatology symptoms, rash and scar.  Neurologic: Denied neurology symptoms, dizziness, headache, neck pain and syncope.  Psychiatric: Denied psychiatric symptoms, anxiety and depression.  Endocrine: Denied endocrine symptoms including hot flashes and night sweats.   Meds:   Current Outpatient Medications on File Prior to Visit  Medication Sig Dispense Refill   albuterol (VENTOLIN HFA) 108 (90 Base) MCG/ACT inhaler Inhale into the lungs every 6 (six) hours as needed.     estradiol (ESTRACE) 2 MG tablet TAKE 1 TABLET BY MOUTH EVERY DAY 90 tablet 0   medroxyPROGESTERone (PROVERA) 5 MG tablet TAKE 1 TABLET (5 MG TOTAL) BY MOUTH DAILY. 90 tablet 1   meloxicam (MOBIC) 15 MG tablet Take 15 mg by mouth daily.     No current facility-administered medications on file prior to visit.  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 Postmenopausal hormone therapy     Patient doing well on HRT.  Hot flashes much improved.   Plan:            1.  Continue HRT. 2.  Follow-up for annual exam   orders No orders of the defined types were placed in this encounter.   No orders of the defined types were placed in this encounter.     F/U  Return for Annual Physical. I spent 21 minutes involved in the care of this patient preparing to see the patient by obtaining and reviewing her medical history (including labs, imaging tests and prior procedures), documenting clinical information in the electronic health record (EHR), counseling and coordinating care plans, writing and sending prescriptions, ordering tests or procedures and in direct communicating with the patient and medical staff discussing pertinent items from her history and physical exam.  Elonda Husky, M.D. 05/17/2022 3:02 PM

## 2022-05-23 DIAGNOSIS — M79675 Pain in left toe(s): Secondary | ICD-10-CM | POA: Diagnosis not present

## 2022-05-23 DIAGNOSIS — B351 Tinea unguium: Secondary | ICD-10-CM | POA: Diagnosis not present

## 2022-05-23 DIAGNOSIS — L600 Ingrowing nail: Secondary | ICD-10-CM | POA: Diagnosis not present

## 2022-05-23 DIAGNOSIS — M79674 Pain in right toe(s): Secondary | ICD-10-CM | POA: Diagnosis not present

## 2022-05-25 DIAGNOSIS — F331 Major depressive disorder, recurrent, moderate: Secondary | ICD-10-CM | POA: Diagnosis not present

## 2022-05-25 DIAGNOSIS — F411 Generalized anxiety disorder: Secondary | ICD-10-CM | POA: Diagnosis not present

## 2022-05-30 DIAGNOSIS — M5412 Radiculopathy, cervical region: Secondary | ICD-10-CM | POA: Diagnosis not present

## 2022-05-30 DIAGNOSIS — M542 Cervicalgia: Secondary | ICD-10-CM | POA: Diagnosis not present

## 2022-05-30 DIAGNOSIS — R200 Anesthesia of skin: Secondary | ICD-10-CM | POA: Diagnosis not present

## 2022-05-30 DIAGNOSIS — M9931 Osseous stenosis of neural canal of cervical region: Secondary | ICD-10-CM | POA: Diagnosis not present

## 2022-06-08 DIAGNOSIS — F331 Major depressive disorder, recurrent, moderate: Secondary | ICD-10-CM | POA: Diagnosis not present

## 2022-06-12 DIAGNOSIS — M5412 Radiculopathy, cervical region: Secondary | ICD-10-CM | POA: Diagnosis not present

## 2022-06-27 DIAGNOSIS — M542 Cervicalgia: Secondary | ICD-10-CM | POA: Diagnosis not present

## 2022-06-27 DIAGNOSIS — M5412 Radiculopathy, cervical region: Secondary | ICD-10-CM | POA: Diagnosis not present

## 2022-06-27 DIAGNOSIS — M9931 Osseous stenosis of neural canal of cervical region: Secondary | ICD-10-CM | POA: Diagnosis not present

## 2022-06-27 DIAGNOSIS — R200 Anesthesia of skin: Secondary | ICD-10-CM | POA: Diagnosis not present

## 2022-07-03 ENCOUNTER — Other Ambulatory Visit: Payer: Self-pay | Admitting: Physical Medicine & Rehabilitation

## 2022-07-03 DIAGNOSIS — M5412 Radiculopathy, cervical region: Secondary | ICD-10-CM

## 2022-07-04 DIAGNOSIS — R200 Anesthesia of skin: Secondary | ICD-10-CM | POA: Diagnosis not present

## 2022-07-11 DIAGNOSIS — F1521 Other stimulant dependence, in remission: Secondary | ICD-10-CM | POA: Diagnosis not present

## 2022-07-11 DIAGNOSIS — F331 Major depressive disorder, recurrent, moderate: Secondary | ICD-10-CM | POA: Diagnosis not present

## 2022-07-11 DIAGNOSIS — F411 Generalized anxiety disorder: Secondary | ICD-10-CM | POA: Diagnosis not present

## 2022-07-20 ENCOUNTER — Other Ambulatory Visit: Payer: Self-pay | Admitting: Obstetrics and Gynecology

## 2022-07-20 DIAGNOSIS — E2839 Other primary ovarian failure: Secondary | ICD-10-CM

## 2022-07-21 ENCOUNTER — Other Ambulatory Visit: Payer: Medicaid Other

## 2022-07-21 DIAGNOSIS — B9689 Other specified bacterial agents as the cause of diseases classified elsewhere: Secondary | ICD-10-CM | POA: Diagnosis not present

## 2022-07-21 DIAGNOSIS — J019 Acute sinusitis, unspecified: Secondary | ICD-10-CM | POA: Diagnosis not present

## 2022-07-21 DIAGNOSIS — J4521 Mild intermittent asthma with (acute) exacerbation: Secondary | ICD-10-CM | POA: Diagnosis not present

## 2022-07-21 DIAGNOSIS — J209 Acute bronchitis, unspecified: Secondary | ICD-10-CM | POA: Diagnosis not present

## 2022-07-21 DIAGNOSIS — R0602 Shortness of breath: Secondary | ICD-10-CM | POA: Diagnosis not present

## 2022-07-21 DIAGNOSIS — Z03818 Encounter for observation for suspected exposure to other biological agents ruled out: Secondary | ICD-10-CM | POA: Diagnosis not present

## 2022-07-21 DIAGNOSIS — J9801 Acute bronchospasm: Secondary | ICD-10-CM | POA: Diagnosis not present

## 2022-07-21 DIAGNOSIS — J028 Acute pharyngitis due to other specified organisms: Secondary | ICD-10-CM | POA: Diagnosis not present

## 2022-07-21 DIAGNOSIS — R051 Acute cough: Secondary | ICD-10-CM | POA: Diagnosis not present

## 2022-07-26 DIAGNOSIS — F411 Generalized anxiety disorder: Secondary | ICD-10-CM | POA: Diagnosis not present

## 2022-07-26 DIAGNOSIS — F331 Major depressive disorder, recurrent, moderate: Secondary | ICD-10-CM | POA: Diagnosis not present

## 2022-08-07 DIAGNOSIS — F411 Generalized anxiety disorder: Secondary | ICD-10-CM | POA: Diagnosis not present

## 2022-08-07 DIAGNOSIS — F1521 Other stimulant dependence, in remission: Secondary | ICD-10-CM | POA: Diagnosis not present

## 2022-08-07 DIAGNOSIS — F331 Major depressive disorder, recurrent, moderate: Secondary | ICD-10-CM | POA: Diagnosis not present

## 2022-08-11 ENCOUNTER — Other Ambulatory Visit: Payer: 59

## 2022-08-15 NOTE — Discharge Instructions (Signed)

## 2022-08-16 ENCOUNTER — Ambulatory Visit
Admission: RE | Admit: 2022-08-16 | Discharge: 2022-08-16 | Disposition: A | Discharge status: Home / Self Care | Payer: Medicaid Other | Source: Ambulatory Visit | Attending: Physical Medicine & Rehabilitation | Admitting: Physical Medicine & Rehabilitation

## 2022-08-16 DIAGNOSIS — M5412 Radiculopathy, cervical region: Secondary | ICD-10-CM | POA: Diagnosis not present

## 2022-08-16 DIAGNOSIS — M4802 Spinal stenosis, cervical region: Secondary | ICD-10-CM | POA: Diagnosis not present

## 2022-08-16 MED ORDER — TRIAMCINOLONE ACETONIDE 40 MG/ML IJ SUSP (RADIOLOGY)
60.0000 mg | Freq: Once | INTRAMUSCULAR | Status: AC
  Administered 2022-08-16: 60 mg via EPIDURAL

## 2022-08-16 MED ORDER — IOPAMIDOL (ISOVUE-M 300) INJECTION 61%
1.0000 mL | Freq: Once | INTRAMUSCULAR | Status: AC
  Administered 2022-08-16: 1 mL via EPIDURAL

## 2022-08-17 DIAGNOSIS — M79675 Pain in left toe(s): Secondary | ICD-10-CM | POA: Diagnosis not present

## 2022-08-17 DIAGNOSIS — M79674 Pain in right toe(s): Secondary | ICD-10-CM | POA: Diagnosis not present

## 2022-08-17 DIAGNOSIS — L600 Ingrowing nail: Secondary | ICD-10-CM | POA: Diagnosis not present

## 2022-08-17 DIAGNOSIS — B351 Tinea unguium: Secondary | ICD-10-CM | POA: Diagnosis not present

## 2022-08-22 DIAGNOSIS — F331 Major depressive disorder, recurrent, moderate: Secondary | ICD-10-CM | POA: Diagnosis not present

## 2022-09-04 DIAGNOSIS — E790 Hyperuricemia without signs of inflammatory arthritis and tophaceous disease: Secondary | ICD-10-CM | POA: Diagnosis not present

## 2022-09-04 DIAGNOSIS — R413 Other amnesia: Secondary | ICD-10-CM | POA: Diagnosis not present

## 2022-09-04 DIAGNOSIS — Z0000 Encounter for general adult medical examination without abnormal findings: Secondary | ICD-10-CM | POA: Diagnosis not present

## 2022-09-04 DIAGNOSIS — M5412 Radiculopathy, cervical region: Secondary | ICD-10-CM | POA: Diagnosis not present

## 2022-09-09 ENCOUNTER — Emergency Department
Admission: EM | Admit: 2022-09-09 | Discharge: 2022-09-09 | Disposition: A | Discharge status: Home / Self Care | Payer: 59 | Attending: Emergency Medicine | Admitting: Emergency Medicine

## 2022-09-09 ENCOUNTER — Other Ambulatory Visit: Payer: Self-pay

## 2022-09-09 ENCOUNTER — Encounter: Payer: Self-pay | Admitting: Intensive Care

## 2022-09-09 DIAGNOSIS — L509 Urticaria, unspecified: Secondary | ICD-10-CM | POA: Diagnosis not present

## 2022-09-09 HISTORY — DX: Gout, unspecified: M10.9

## 2022-09-09 MED ORDER — PREDNISONE 10 MG PO TABS: 40.0000 mg | ORAL_TABLET | Freq: Every day | ORAL | 0 refills | Status: AC

## 2022-09-09 MED ORDER — DIPHENHYDRAMINE HCL 25 MG PO CAPS
50.0000 mg | ORAL_CAPSULE | Freq: Once | ORAL | Status: AC
  Administered 2022-09-09: 50 mg via ORAL
  Filled 2022-09-09: qty 2

## 2022-09-09 MED ORDER — CETIRIZINE HCL 10 MG PO TABS: 10.0000 mg | ORAL_TABLET | Freq: Every day | ORAL | 0 refills | Status: DC

## 2022-09-09 MED ORDER — DEXAMETHASONE SODIUM PHOSPHATE 10 MG/ML IJ SOLN
10.0000 mg | Freq: Once | INTRAMUSCULAR | Status: AC
  Administered 2022-09-09: 10 mg via INTRAMUSCULAR
  Filled 2022-09-09: qty 1

## 2022-09-09 MED ORDER — FAMOTIDINE 20 MG PO TABS
20.0000 mg | ORAL_TABLET | Freq: Once | ORAL | Status: AC
  Administered 2022-09-09: 20 mg via ORAL
  Filled 2022-09-09: qty 1

## 2022-09-09 NOTE — ED Provider Notes (Signed)
[facility] Provider Note    Event Date/Time   First MD Initiated Contact with Patient 09/09/22 1625     (approximate)   History   Urticaria   HPI Caitlin Schneider is a 42 y.o. female presenting today for urticaria.  Patient notes 2 days ago she started having hives first appearing on her arms and is now spread throughout her body.  She has tried Benadryl with some relief.  She denies shortness of breath, throat closing, other systemic symptoms.  No new detergents or cleaning products.  No new clothing or sheets.  No new foods.  No new medications.     Physical Exam   Triage Vital Signs: ED Triage Vitals  Encounter Vitals Group     BP 09/09/22 1512 126/87     Systolic BP Percentile --      Diastolic BP Percentile --      Pulse Rate 09/09/22 1512 98     Resp 09/09/22 1512 18     Temp 09/09/22 1512 98.2 F (36.8 C)     Temp Source 09/09/22 1512 Oral     SpO2 09/09/22 1512 98 %     Weight 09/09/22 1510 213 lb (96.6 kg)     Height 09/09/22 1510 5' 2.5" (1.588 m)     Head Circumference --      Peak Flow --      Pain Score 09/09/22 1510 0     Pain Loc --      Pain Education --      Exclude from Growth Chart --     Most recent vital signs: Vitals:   09/09/22 1512  BP: 126/87  Pulse: 98  Resp: 18  Temp: 98.2 F (36.8 C)  SpO2: 98%   I have reviewed the vital signs. General:  Awake, alert, no acute distress. Head:  Normocephalic, Atraumatic. EENT:  PERRL, EOMI, Oral mucosa pink and moist, Neck is supple. Cardiovascular: Regular rate, 2+ distal pulses. Respiratory:  Normal respiratory effort, symmetrical expansion, no distress.   Extremities:  Moving all four extremities through full ROM without pain.   Neuro:  Alert and oriented.  Interacting appropriately.   Skin: Urticarial rash spread throughout bilateral upper and lower extremities as well as abdomen and back. Psych: Appropriate affect.     ED Results / Procedures / Treatments    Labs (all labs ordered are listed, but only abnormal results are displayed) Labs Reviewed - No data to display   EKG    RADIOLOGY    PROCEDURES:  Critical Care performed: No  Procedures   MEDICATIONS ORDERED IN ED: Medications  dexamethasone (DECADRON) injection 10 mg (has no administration in time range)  famotidine (PEPCID) tablet 20 mg (has no administration in time range)  diphenhydrAMINE (BENADRYL) capsule 50 mg (has no administration in time range)     IMPRESSION / MDM / ASSESSMENT AND PLAN / ED COURSE  I reviewed the triage vital signs and the nursing notes.                              Differential diagnosis includes, but is not limited to, allergic reaction, urticarial rash, less likely drug reaction.  Patient's presentation is most consistent with acute, uncomplicated illness.  Patient is a 42 year old female presenting today for 2 days of urticarial rash.  No difficulty breathing or throat closing symptoms concerning for anaphylactic reaction.  Will start patient on steroid, antihistamine regiment.  Patient safe for discharge and given strict return precautions.     FINAL CLINICAL IMPRESSION(S) / ED DIAGNOSES   Final diagnoses:  Urticaria     Rx / DC Orders   ED Discharge Orders          Ordered    predniSONE (DELTASONE) 10 MG tablet  Daily        09/09/22 1646    cetirizine (ZYRTEC ALLERGY) 10 MG tablet  Daily        09/09/22 1646             Note:  This document was prepared using Dragon voice recognition software and may include unintentional dictation errors.   Janith Lima, MD 09/09/22 [phone]

## 2022-09-09 NOTE — ED Triage Notes (Addendum)
Patient presents with hives all over body that started 2 days ago. C/o itching  Last took benadryl around noon today

## 2022-09-09 NOTE — Discharge Instructions (Signed)
You were seen in the emergency department today for your urticarial rash.  I have sent additional medicines to your pharmacy to pick up and take over the next several days.  Please take these as prescribed.  Additionally, you can take Benadryl every 8 hours as needed to help with itchiness symptoms.  You can buy over-the-counter anti-itch creams to apply to the most affected sites as well.  Please follow-up with your primary care provider for reassessment.  Please return to the emergency department if you have any difficulty breathing or signs of throat swelling.

## 2022-09-12 DIAGNOSIS — M5412 Radiculopathy, cervical region: Secondary | ICD-10-CM | POA: Diagnosis not present

## 2022-09-12 DIAGNOSIS — M5441 Lumbago with sciatica, right side: Secondary | ICD-10-CM | POA: Diagnosis not present

## 2022-09-12 DIAGNOSIS — G8929 Other chronic pain: Secondary | ICD-10-CM | POA: Diagnosis not present

## 2022-09-12 DIAGNOSIS — M5442 Lumbago with sciatica, left side: Secondary | ICD-10-CM | POA: Diagnosis not present

## 2022-09-12 DIAGNOSIS — M542 Cervicalgia: Secondary | ICD-10-CM | POA: Diagnosis not present

## 2022-09-14 DIAGNOSIS — F411 Generalized anxiety disorder: Secondary | ICD-10-CM | POA: Diagnosis not present

## 2022-09-14 DIAGNOSIS — F331 Major depressive disorder, recurrent, moderate: Secondary | ICD-10-CM | POA: Diagnosis not present

## 2022-09-14 DIAGNOSIS — F1521 Other stimulant dependence, in remission: Secondary | ICD-10-CM | POA: Diagnosis not present

## 2022-09-27 ENCOUNTER — Other Ambulatory Visit: Payer: Self-pay | Admitting: Physical Medicine & Rehabilitation

## 2022-09-27 DIAGNOSIS — G8929 Other chronic pain: Secondary | ICD-10-CM

## 2022-09-28 DIAGNOSIS — F411 Generalized anxiety disorder: Secondary | ICD-10-CM | POA: Diagnosis not present

## 2022-09-28 DIAGNOSIS — F331 Major depressive disorder, recurrent, moderate: Secondary | ICD-10-CM | POA: Diagnosis not present

## 2022-10-06 ENCOUNTER — Encounter: Payer: Self-pay | Admitting: Physical Medicine & Rehabilitation

## 2022-10-12 DIAGNOSIS — F331 Major depressive disorder, recurrent, moderate: Secondary | ICD-10-CM | POA: Diagnosis not present

## 2022-10-12 DIAGNOSIS — F411 Generalized anxiety disorder: Secondary | ICD-10-CM | POA: Diagnosis not present

## 2022-10-12 DIAGNOSIS — F1521 Other stimulant dependence, in remission: Secondary | ICD-10-CM | POA: Diagnosis not present

## 2022-10-13 ENCOUNTER — Ambulatory Visit
Admission: RE | Admit: 2022-10-13 | Discharge: 2022-10-13 | Disposition: A | Discharge status: Home / Self Care | Payer: 59 | Source: Ambulatory Visit | Attending: Physical Medicine & Rehabilitation | Admitting: Physical Medicine & Rehabilitation

## 2022-10-13 DIAGNOSIS — M5441 Lumbago with sciatica, right side: Secondary | ICD-10-CM | POA: Diagnosis not present

## 2022-10-13 DIAGNOSIS — G8929 Other chronic pain: Secondary | ICD-10-CM | POA: Diagnosis not present

## 2022-10-13 DIAGNOSIS — M5442 Lumbago with sciatica, left side: Secondary | ICD-10-CM | POA: Diagnosis not present

## 2022-10-24 DIAGNOSIS — M542 Cervicalgia: Secondary | ICD-10-CM | POA: Diagnosis not present

## 2022-10-24 DIAGNOSIS — M5412 Radiculopathy, cervical region: Secondary | ICD-10-CM | POA: Diagnosis not present

## 2022-10-24 DIAGNOSIS — M5442 Lumbago with sciatica, left side: Secondary | ICD-10-CM | POA: Diagnosis not present

## 2022-10-24 DIAGNOSIS — G8929 Other chronic pain: Secondary | ICD-10-CM | POA: Diagnosis not present

## 2022-10-24 DIAGNOSIS — M5441 Lumbago with sciatica, right side: Secondary | ICD-10-CM | POA: Diagnosis not present

## 2022-10-24 DIAGNOSIS — M5416 Radiculopathy, lumbar region: Secondary | ICD-10-CM | POA: Diagnosis not present

## 2022-10-26 DIAGNOSIS — F1521 Other stimulant dependence, in remission: Secondary | ICD-10-CM | POA: Diagnosis not present

## 2022-10-26 DIAGNOSIS — F411 Generalized anxiety disorder: Secondary | ICD-10-CM | POA: Diagnosis not present

## 2022-10-26 DIAGNOSIS — F331 Major depressive disorder, recurrent, moderate: Secondary | ICD-10-CM | POA: Diagnosis not present

## 2022-11-02 DIAGNOSIS — M5416 Radiculopathy, lumbar region: Secondary | ICD-10-CM | POA: Diagnosis not present

## 2022-11-13 DIAGNOSIS — F411 Generalized anxiety disorder: Secondary | ICD-10-CM | POA: Diagnosis not present

## 2022-11-13 DIAGNOSIS — F331 Major depressive disorder, recurrent, moderate: Secondary | ICD-10-CM | POA: Diagnosis not present

## 2022-11-13 DIAGNOSIS — F1521 Other stimulant dependence, in remission: Secondary | ICD-10-CM | POA: Diagnosis not present

## 2022-11-21 DIAGNOSIS — M5416 Radiculopathy, lumbar region: Secondary | ICD-10-CM | POA: Diagnosis not present

## 2022-11-21 DIAGNOSIS — G8929 Other chronic pain: Secondary | ICD-10-CM | POA: Diagnosis not present

## 2022-11-21 DIAGNOSIS — M5441 Lumbago with sciatica, right side: Secondary | ICD-10-CM | POA: Diagnosis not present

## 2022-11-21 DIAGNOSIS — M5412 Radiculopathy, cervical region: Secondary | ICD-10-CM | POA: Diagnosis not present

## 2022-11-21 DIAGNOSIS — M5442 Lumbago with sciatica, left side: Secondary | ICD-10-CM | POA: Diagnosis not present

## 2022-11-21 DIAGNOSIS — M542 Cervicalgia: Secondary | ICD-10-CM | POA: Diagnosis not present

## 2022-11-27 DIAGNOSIS — F411 Generalized anxiety disorder: Secondary | ICD-10-CM | POA: Diagnosis not present

## 2022-11-27 DIAGNOSIS — F331 Major depressive disorder, recurrent, moderate: Secondary | ICD-10-CM | POA: Diagnosis not present

## 2022-11-27 DIAGNOSIS — F1521 Other stimulant dependence, in remission: Secondary | ICD-10-CM | POA: Diagnosis not present

## 2022-12-04 DIAGNOSIS — F1521 Other stimulant dependence, in remission: Secondary | ICD-10-CM | POA: Diagnosis not present

## 2022-12-04 DIAGNOSIS — F331 Major depressive disorder, recurrent, moderate: Secondary | ICD-10-CM | POA: Diagnosis not present

## 2022-12-04 DIAGNOSIS — F411 Generalized anxiety disorder: Secondary | ICD-10-CM | POA: Diagnosis not present

## 2022-12-07 DIAGNOSIS — M5416 Radiculopathy, lumbar region: Secondary | ICD-10-CM | POA: Diagnosis not present

## 2022-12-11 DIAGNOSIS — M5012 Mid-cervical disc disorder, unspecified level: Secondary | ICD-10-CM | POA: Diagnosis not present

## 2022-12-11 DIAGNOSIS — G5603 Carpal tunnel syndrome, bilateral upper limbs: Secondary | ICD-10-CM | POA: Diagnosis not present

## 2022-12-19 ENCOUNTER — Other Ambulatory Visit: Payer: Self-pay | Admitting: Physical Medicine & Rehabilitation

## 2022-12-19 DIAGNOSIS — M5412 Radiculopathy, cervical region: Secondary | ICD-10-CM | POA: Diagnosis not present

## 2022-12-19 DIAGNOSIS — M542 Cervicalgia: Secondary | ICD-10-CM | POA: Diagnosis not present

## 2022-12-19 DIAGNOSIS — G8929 Other chronic pain: Secondary | ICD-10-CM | POA: Diagnosis not present

## 2022-12-19 DIAGNOSIS — M5441 Lumbago with sciatica, right side: Secondary | ICD-10-CM | POA: Diagnosis not present

## 2022-12-19 DIAGNOSIS — M5416 Radiculopathy, lumbar region: Secondary | ICD-10-CM | POA: Diagnosis not present

## 2022-12-19 DIAGNOSIS — M5442 Lumbago with sciatica, left side: Secondary | ICD-10-CM | POA: Diagnosis not present

## 2022-12-28 DIAGNOSIS — F411 Generalized anxiety disorder: Secondary | ICD-10-CM | POA: Diagnosis not present

## 2022-12-28 DIAGNOSIS — F331 Major depressive disorder, recurrent, moderate: Secondary | ICD-10-CM | POA: Diagnosis not present

## 2022-12-28 DIAGNOSIS — F1521 Other stimulant dependence, in remission: Secondary | ICD-10-CM | POA: Diagnosis not present

## 2023-01-02 NOTE — Discharge Instructions (Signed)

## 2023-01-03 ENCOUNTER — Ambulatory Visit
Admission: RE | Admit: 2023-01-03 | Discharge: 2023-01-03 | Disposition: A | Discharge status: Home / Self Care | Payer: 59 | Source: Ambulatory Visit | Attending: Physical Medicine & Rehabilitation | Admitting: Physical Medicine & Rehabilitation

## 2023-01-03 DIAGNOSIS — M5412 Radiculopathy, cervical region: Secondary | ICD-10-CM

## 2023-01-03 MED ORDER — IOPAMIDOL (ISOVUE-M 300) INJECTION 61%
1.0000 mL | Freq: Once | INTRAMUSCULAR | Status: AC | PRN
  Administered 2023-01-03: 1 mL via INTRATHECAL

## 2023-01-03 MED ORDER — TRIAMCINOLONE ACETONIDE 40 MG/ML IJ SUSP (RADIOLOGY)
60.0000 mg | Freq: Once | INTRAMUSCULAR | Status: AC
  Administered 2023-01-03: 60 mg via EPIDURAL

## 2023-01-09 ENCOUNTER — Inpatient Hospital Stay: Admission: RE | Admit: 2023-01-09 | Payer: 59 | Source: Ambulatory Visit

## 2023-01-10 DIAGNOSIS — G5603 Carpal tunnel syndrome, bilateral upper limbs: Secondary | ICD-10-CM | POA: Diagnosis not present

## 2023-01-10 DIAGNOSIS — M5012 Mid-cervical disc disorder, unspecified level: Secondary | ICD-10-CM | POA: Diagnosis not present

## 2023-01-11 DIAGNOSIS — F331 Major depressive disorder, recurrent, moderate: Secondary | ICD-10-CM | POA: Diagnosis not present

## 2023-01-11 DIAGNOSIS — F411 Generalized anxiety disorder: Secondary | ICD-10-CM | POA: Diagnosis not present

## 2023-01-19 ENCOUNTER — Other Ambulatory Visit: Payer: Self-pay | Admitting: Internal Medicine

## 2023-01-19 DIAGNOSIS — R413 Other amnesia: Secondary | ICD-10-CM | POA: Diagnosis not present

## 2023-01-19 DIAGNOSIS — J4521 Mild intermittent asthma with (acute) exacerbation: Secondary | ICD-10-CM | POA: Diagnosis not present

## 2023-01-19 DIAGNOSIS — Z23 Encounter for immunization: Secondary | ICD-10-CM | POA: Diagnosis not present

## 2023-01-19 DIAGNOSIS — Z1231 Encounter for screening mammogram for malignant neoplasm of breast: Secondary | ICD-10-CM

## 2023-01-19 DIAGNOSIS — M5412 Radiculopathy, cervical region: Secondary | ICD-10-CM | POA: Diagnosis not present

## 2023-01-19 DIAGNOSIS — E790 Hyperuricemia without signs of inflammatory arthritis and tophaceous disease: Secondary | ICD-10-CM | POA: Diagnosis not present

## 2023-01-22 ENCOUNTER — Other Ambulatory Visit: Payer: Self-pay | Admitting: Internal Medicine

## 2023-01-22 DIAGNOSIS — R413 Other amnesia: Secondary | ICD-10-CM

## 2023-01-23 DIAGNOSIS — M5442 Lumbago with sciatica, left side: Secondary | ICD-10-CM | POA: Diagnosis not present

## 2023-01-23 DIAGNOSIS — M5416 Radiculopathy, lumbar region: Secondary | ICD-10-CM | POA: Diagnosis not present

## 2023-01-23 DIAGNOSIS — M542 Cervicalgia: Secondary | ICD-10-CM | POA: Diagnosis not present

## 2023-01-23 DIAGNOSIS — G8929 Other chronic pain: Secondary | ICD-10-CM | POA: Diagnosis not present

## 2023-01-23 DIAGNOSIS — M5412 Radiculopathy, cervical region: Secondary | ICD-10-CM | POA: Diagnosis not present

## 2023-01-23 DIAGNOSIS — M5441 Lumbago with sciatica, right side: Secondary | ICD-10-CM | POA: Diagnosis not present

## 2023-01-29 DIAGNOSIS — F331 Major depressive disorder, recurrent, moderate: Secondary | ICD-10-CM | POA: Diagnosis not present

## 2023-02-06 ENCOUNTER — Ambulatory Visit
Admission: RE | Admit: 2023-02-06 | Discharge: 2023-02-06 | Disposition: A | Discharge status: Home / Self Care | Payer: 59 | Source: Ambulatory Visit | Attending: Internal Medicine | Admitting: Internal Medicine

## 2023-02-06 DIAGNOSIS — Z1231 Encounter for screening mammogram for malignant neoplasm of breast: Secondary | ICD-10-CM | POA: Insufficient documentation

## 2023-02-07 ENCOUNTER — Ambulatory Visit
Admission: RE | Admit: 2023-02-07 | Discharge: 2023-02-07 | Discharge status: Home / Self Care | Payer: Medicaid Other | Source: Ambulatory Visit | Attending: Internal Medicine | Admitting: Internal Medicine

## 2023-02-07 DIAGNOSIS — R930 Abnormal findings on diagnostic imaging of skull and head, not elsewhere classified: Secondary | ICD-10-CM | POA: Diagnosis not present

## 2023-02-07 DIAGNOSIS — R413 Other amnesia: Secondary | ICD-10-CM

## 2023-02-19 DIAGNOSIS — F331 Major depressive disorder, recurrent, moderate: Secondary | ICD-10-CM | POA: Diagnosis not present

## 2023-02-28 DIAGNOSIS — F439 Reaction to severe stress, unspecified: Secondary | ICD-10-CM | POA: Diagnosis not present

## 2023-02-28 DIAGNOSIS — R0683 Snoring: Secondary | ICD-10-CM | POA: Diagnosis not present

## 2023-02-28 DIAGNOSIS — R413 Other amnesia: Secondary | ICD-10-CM | POA: Diagnosis not present

## 2023-02-28 DIAGNOSIS — G5603 Carpal tunnel syndrome, bilateral upper limbs: Secondary | ICD-10-CM | POA: Diagnosis not present

## 2023-03-05 DIAGNOSIS — F411 Generalized anxiety disorder: Secondary | ICD-10-CM | POA: Diagnosis not present

## 2023-03-05 DIAGNOSIS — F331 Major depressive disorder, recurrent, moderate: Secondary | ICD-10-CM | POA: Diagnosis not present

## 2023-03-07 DIAGNOSIS — M25512 Pain in left shoulder: Secondary | ICD-10-CM | POA: Diagnosis not present

## 2023-03-07 DIAGNOSIS — M5441 Lumbago with sciatica, right side: Secondary | ICD-10-CM | POA: Diagnosis not present

## 2023-03-07 DIAGNOSIS — M5412 Radiculopathy, cervical region: Secondary | ICD-10-CM | POA: Diagnosis not present

## 2023-03-07 DIAGNOSIS — M542 Cervicalgia: Secondary | ICD-10-CM | POA: Diagnosis not present

## 2023-03-07 DIAGNOSIS — G8929 Other chronic pain: Secondary | ICD-10-CM | POA: Diagnosis not present

## 2023-03-07 DIAGNOSIS — M5416 Radiculopathy, lumbar region: Secondary | ICD-10-CM | POA: Diagnosis not present

## 2023-03-07 DIAGNOSIS — M5442 Lumbago with sciatica, left side: Secondary | ICD-10-CM | POA: Diagnosis not present

## 2023-03-19 DIAGNOSIS — M5416 Radiculopathy, lumbar region: Secondary | ICD-10-CM | POA: Diagnosis not present

## 2023-03-26 DIAGNOSIS — F331 Major depressive disorder, recurrent, moderate: Secondary | ICD-10-CM | POA: Diagnosis not present

## 2023-03-26 DIAGNOSIS — F411 Generalized anxiety disorder: Secondary | ICD-10-CM | POA: Diagnosis not present

## 2023-03-27 DIAGNOSIS — F4310 Post-traumatic stress disorder, unspecified: Secondary | ICD-10-CM | POA: Diagnosis not present

## 2023-03-27 DIAGNOSIS — Z1330 Encounter for screening examination for mental health and behavioral disorders, unspecified: Secondary | ICD-10-CM | POA: Diagnosis not present

## 2023-03-27 DIAGNOSIS — F331 Major depressive disorder, recurrent, moderate: Secondary | ICD-10-CM | POA: Diagnosis not present

## 2023-03-27 DIAGNOSIS — Z1331 Encounter for screening for depression: Secondary | ICD-10-CM | POA: Diagnosis not present

## 2023-03-27 DIAGNOSIS — F411 Generalized anxiety disorder: Secondary | ICD-10-CM | POA: Diagnosis not present

## 2023-04-02 DIAGNOSIS — F4310 Post-traumatic stress disorder, unspecified: Secondary | ICD-10-CM | POA: Diagnosis not present

## 2023-04-02 DIAGNOSIS — F411 Generalized anxiety disorder: Secondary | ICD-10-CM | POA: Diagnosis not present

## 2023-04-03 ENCOUNTER — Encounter: Payer: Self-pay | Admitting: Physical Medicine & Rehabilitation

## 2023-04-03 DIAGNOSIS — M25512 Pain in left shoulder: Secondary | ICD-10-CM | POA: Diagnosis not present

## 2023-04-03 DIAGNOSIS — M542 Cervicalgia: Secondary | ICD-10-CM | POA: Diagnosis not present

## 2023-04-03 DIAGNOSIS — M5416 Radiculopathy, lumbar region: Secondary | ICD-10-CM | POA: Diagnosis not present

## 2023-04-03 DIAGNOSIS — M5412 Radiculopathy, cervical region: Secondary | ICD-10-CM | POA: Diagnosis not present

## 2023-04-03 DIAGNOSIS — M5442 Lumbago with sciatica, left side: Secondary | ICD-10-CM | POA: Diagnosis not present

## 2023-04-03 DIAGNOSIS — G8929 Other chronic pain: Secondary | ICD-10-CM | POA: Diagnosis not present

## 2023-04-03 DIAGNOSIS — M5441 Lumbago with sciatica, right side: Secondary | ICD-10-CM | POA: Diagnosis not present

## 2023-04-04 ENCOUNTER — Other Ambulatory Visit: Payer: Self-pay | Admitting: Physical Medicine & Rehabilitation

## 2023-04-04 DIAGNOSIS — M5412 Radiculopathy, cervical region: Secondary | ICD-10-CM

## 2023-04-09 DIAGNOSIS — F331 Major depressive disorder, recurrent, moderate: Secondary | ICD-10-CM | POA: Diagnosis not present

## 2023-04-09 DIAGNOSIS — F411 Generalized anxiety disorder: Secondary | ICD-10-CM | POA: Diagnosis not present

## 2023-04-09 DIAGNOSIS — F4310 Post-traumatic stress disorder, unspecified: Secondary | ICD-10-CM | POA: Diagnosis not present

## 2023-04-16 DIAGNOSIS — F411 Generalized anxiety disorder: Secondary | ICD-10-CM | POA: Diagnosis not present

## 2023-04-19 DIAGNOSIS — J4521 Mild intermittent asthma with (acute) exacerbation: Secondary | ICD-10-CM | POA: Diagnosis not present

## 2023-04-19 DIAGNOSIS — M5412 Radiculopathy, cervical region: Secondary | ICD-10-CM | POA: Diagnosis not present

## 2023-04-19 DIAGNOSIS — L500 Allergic urticaria: Secondary | ICD-10-CM | POA: Diagnosis not present

## 2023-04-19 DIAGNOSIS — R413 Other amnesia: Secondary | ICD-10-CM | POA: Diagnosis not present

## 2023-04-19 DIAGNOSIS — K580 Irritable bowel syndrome with diarrhea: Secondary | ICD-10-CM | POA: Diagnosis not present

## 2023-04-23 DIAGNOSIS — F4310 Post-traumatic stress disorder, unspecified: Secondary | ICD-10-CM | POA: Diagnosis not present

## 2023-04-23 DIAGNOSIS — F331 Major depressive disorder, recurrent, moderate: Secondary | ICD-10-CM | POA: Diagnosis not present

## 2023-04-23 DIAGNOSIS — F411 Generalized anxiety disorder: Secondary | ICD-10-CM | POA: Diagnosis not present

## 2023-04-24 DIAGNOSIS — F4310 Post-traumatic stress disorder, unspecified: Secondary | ICD-10-CM | POA: Diagnosis not present

## 2023-04-24 DIAGNOSIS — Z1331 Encounter for screening for depression: Secondary | ICD-10-CM | POA: Diagnosis not present

## 2023-04-24 DIAGNOSIS — Z1330 Encounter for screening examination for mental health and behavioral disorders, unspecified: Secondary | ICD-10-CM | POA: Diagnosis not present

## 2023-04-24 DIAGNOSIS — F515 Nightmare disorder: Secondary | ICD-10-CM | POA: Diagnosis not present

## 2023-04-24 DIAGNOSIS — F331 Major depressive disorder, recurrent, moderate: Secondary | ICD-10-CM | POA: Diagnosis not present

## 2023-04-24 DIAGNOSIS — F411 Generalized anxiety disorder: Secondary | ICD-10-CM | POA: Diagnosis not present

## 2023-04-30 ENCOUNTER — Other Ambulatory Visit

## 2023-05-02 NOTE — Discharge Instructions (Signed)

## 2023-05-03 ENCOUNTER — Ambulatory Visit
Admission: RE | Admit: 2023-05-03 | Discharge: 2023-05-03 | Disposition: A | Discharge status: Home / Self Care | Source: Ambulatory Visit | Attending: Physical Medicine & Rehabilitation | Admitting: Physical Medicine & Rehabilitation

## 2023-05-03 DIAGNOSIS — M5412 Radiculopathy, cervical region: Secondary | ICD-10-CM

## 2023-05-03 MED ORDER — IOPAMIDOL (ISOVUE-M 300) INJECTION 61%
15.0000 mL | Freq: Once | INTRAMUSCULAR | Status: AC | PRN
  Administered 2023-05-03: 3 mL via EPIDURAL

## 2023-05-03 MED ORDER — TRIAMCINOLONE ACETONIDE 40 MG/ML IJ SUSP (RADIOLOGY)
60.0000 mg | Freq: Once | INTRAMUSCULAR | Status: AC
  Administered 2023-05-03: 60 mg via EPIDURAL

## 2023-05-03 MED ORDER — LIDOCAINE 1 % OPTIME INJ - NO CHARGE
5.0000 mL | Freq: Once | INTRAMUSCULAR | Status: AC
  Administered 2023-05-03: 5 mL via INTRADERMAL

## 2023-05-22 DIAGNOSIS — F4310 Post-traumatic stress disorder, unspecified: Secondary | ICD-10-CM | POA: Diagnosis not present

## 2023-05-22 DIAGNOSIS — F411 Generalized anxiety disorder: Secondary | ICD-10-CM | POA: Diagnosis not present

## 2023-05-22 DIAGNOSIS — Z1331 Encounter for screening for depression: Secondary | ICD-10-CM | POA: Diagnosis not present

## 2023-05-22 DIAGNOSIS — F515 Nightmare disorder: Secondary | ICD-10-CM | POA: Diagnosis not present

## 2023-05-22 DIAGNOSIS — Z1330 Encounter for screening examination for mental health and behavioral disorders, unspecified: Secondary | ICD-10-CM | POA: Diagnosis not present

## 2023-05-22 DIAGNOSIS — F331 Major depressive disorder, recurrent, moderate: Secondary | ICD-10-CM | POA: Diagnosis not present

## 2023-05-23 DIAGNOSIS — M542 Cervicalgia: Secondary | ICD-10-CM | POA: Diagnosis not present

## 2023-05-23 DIAGNOSIS — M5412 Radiculopathy, cervical region: Secondary | ICD-10-CM | POA: Diagnosis not present

## 2023-05-23 DIAGNOSIS — M5442 Lumbago with sciatica, left side: Secondary | ICD-10-CM | POA: Diagnosis not present

## 2023-05-23 DIAGNOSIS — M5416 Radiculopathy, lumbar region: Secondary | ICD-10-CM | POA: Diagnosis not present

## 2023-05-23 DIAGNOSIS — M5441 Lumbago with sciatica, right side: Secondary | ICD-10-CM | POA: Diagnosis not present

## 2023-05-23 DIAGNOSIS — G8929 Other chronic pain: Secondary | ICD-10-CM | POA: Diagnosis not present

## 2023-06-25 DIAGNOSIS — F515 Nightmare disorder: Secondary | ICD-10-CM | POA: Diagnosis not present

## 2023-06-25 DIAGNOSIS — F4310 Post-traumatic stress disorder, unspecified: Secondary | ICD-10-CM | POA: Diagnosis not present

## 2023-06-25 DIAGNOSIS — F331 Major depressive disorder, recurrent, moderate: Secondary | ICD-10-CM | POA: Diagnosis not present

## 2023-06-25 DIAGNOSIS — Z1331 Encounter for screening for depression: Secondary | ICD-10-CM | POA: Diagnosis not present

## 2023-06-25 DIAGNOSIS — Z1330 Encounter for screening examination for mental health and behavioral disorders, unspecified: Secondary | ICD-10-CM | POA: Diagnosis not present

## 2023-07-05 DIAGNOSIS — M5416 Radiculopathy, lumbar region: Secondary | ICD-10-CM | POA: Diagnosis not present

## 2023-07-23 DIAGNOSIS — F4310 Post-traumatic stress disorder, unspecified: Secondary | ICD-10-CM | POA: Diagnosis not present

## 2023-07-23 DIAGNOSIS — Z1331 Encounter for screening for depression: Secondary | ICD-10-CM | POA: Diagnosis not present

## 2023-07-23 DIAGNOSIS — F515 Nightmare disorder: Secondary | ICD-10-CM | POA: Diagnosis not present

## 2023-07-23 DIAGNOSIS — Z1330 Encounter for screening examination for mental health and behavioral disorders, unspecified: Secondary | ICD-10-CM | POA: Diagnosis not present

## 2023-07-23 DIAGNOSIS — F411 Generalized anxiety disorder: Secondary | ICD-10-CM | POA: Diagnosis not present

## 2023-07-23 DIAGNOSIS — F331 Major depressive disorder, recurrent, moderate: Secondary | ICD-10-CM | POA: Diagnosis not present

## 2023-09-03 DIAGNOSIS — Z1331 Encounter for screening for depression: Secondary | ICD-10-CM | POA: Diagnosis not present

## 2023-09-03 DIAGNOSIS — F331 Major depressive disorder, recurrent, moderate: Secondary | ICD-10-CM | POA: Diagnosis not present

## 2023-09-03 DIAGNOSIS — F411 Generalized anxiety disorder: Secondary | ICD-10-CM | POA: Diagnosis not present

## 2023-09-03 DIAGNOSIS — Z1330 Encounter for screening examination for mental health and behavioral disorders, unspecified: Secondary | ICD-10-CM | POA: Diagnosis not present

## 2023-09-03 DIAGNOSIS — F4310 Post-traumatic stress disorder, unspecified: Secondary | ICD-10-CM | POA: Diagnosis not present

## 2023-10-05 DIAGNOSIS — Z1331 Encounter for screening for depression: Secondary | ICD-10-CM | POA: Diagnosis not present

## 2023-10-05 DIAGNOSIS — Z1330 Encounter for screening examination for mental health and behavioral disorders, unspecified: Secondary | ICD-10-CM | POA: Diagnosis not present

## 2023-10-05 DIAGNOSIS — F411 Generalized anxiety disorder: Secondary | ICD-10-CM | POA: Diagnosis not present

## 2023-10-05 DIAGNOSIS — F4310 Post-traumatic stress disorder, unspecified: Secondary | ICD-10-CM | POA: Diagnosis not present

## 2023-10-05 DIAGNOSIS — F515 Nightmare disorder: Secondary | ICD-10-CM | POA: Diagnosis not present

## 2023-10-05 DIAGNOSIS — F331 Major depressive disorder, recurrent, moderate: Secondary | ICD-10-CM | POA: Diagnosis not present

## 2023-11-14 DIAGNOSIS — Z0000 Encounter for general adult medical examination without abnormal findings: Secondary | ICD-10-CM | POA: Diagnosis not present

## 2023-11-21 DIAGNOSIS — Z0000 Encounter for general adult medical examination without abnormal findings: Secondary | ICD-10-CM | POA: Diagnosis not present

## 2023-11-21 DIAGNOSIS — Z1331 Encounter for screening for depression: Secondary | ICD-10-CM | POA: Diagnosis not present

## 2023-11-21 DIAGNOSIS — Z23 Encounter for immunization: Secondary | ICD-10-CM | POA: Diagnosis not present

## 2023-12-11 DIAGNOSIS — M5412 Radiculopathy, cervical region: Secondary | ICD-10-CM | POA: Diagnosis not present

## 2023-12-11 DIAGNOSIS — M542 Cervicalgia: Secondary | ICD-10-CM | POA: Diagnosis not present

## 2023-12-11 DIAGNOSIS — M5442 Lumbago with sciatica, left side: Secondary | ICD-10-CM | POA: Diagnosis not present

## 2023-12-11 DIAGNOSIS — M5441 Lumbago with sciatica, right side: Secondary | ICD-10-CM | POA: Diagnosis not present

## 2023-12-11 DIAGNOSIS — M5416 Radiculopathy, lumbar region: Secondary | ICD-10-CM | POA: Diagnosis not present

## 2023-12-11 DIAGNOSIS — G8929 Other chronic pain: Secondary | ICD-10-CM | POA: Diagnosis not present

## 2023-12-17 DIAGNOSIS — G5603 Carpal tunnel syndrome, bilateral upper limbs: Secondary | ICD-10-CM | POA: Diagnosis not present

## 2023-12-26 DIAGNOSIS — G5603 Carpal tunnel syndrome, bilateral upper limbs: Secondary | ICD-10-CM | POA: Diagnosis not present

## 2023-12-26 DIAGNOSIS — M65311 Trigger thumb, right thumb: Secondary | ICD-10-CM | POA: Diagnosis not present

## 2023-12-26 DIAGNOSIS — M25531 Pain in right wrist: Secondary | ICD-10-CM | POA: Diagnosis not present

## 2023-12-26 DIAGNOSIS — M25532 Pain in left wrist: Secondary | ICD-10-CM | POA: Diagnosis not present

## 2023-12-28 DIAGNOSIS — M5416 Radiculopathy, lumbar region: Secondary | ICD-10-CM | POA: Diagnosis not present

## 2024-01-04 DIAGNOSIS — F4310 Post-traumatic stress disorder, unspecified: Secondary | ICD-10-CM | POA: Diagnosis not present

## 2024-01-04 DIAGNOSIS — Z1330 Encounter for screening examination for mental health and behavioral disorders, unspecified: Secondary | ICD-10-CM | POA: Diagnosis not present

## 2024-01-04 DIAGNOSIS — F411 Generalized anxiety disorder: Secondary | ICD-10-CM | POA: Diagnosis not present

## 2024-01-04 DIAGNOSIS — F331 Major depressive disorder, recurrent, moderate: Secondary | ICD-10-CM | POA: Diagnosis not present

## 2024-01-04 DIAGNOSIS — Z1331 Encounter for screening for depression: Secondary | ICD-10-CM | POA: Diagnosis not present

## 2024-01-09 DIAGNOSIS — M5412 Radiculopathy, cervical region: Secondary | ICD-10-CM | POA: Diagnosis not present

## 2024-01-09 DIAGNOSIS — M5442 Lumbago with sciatica, left side: Secondary | ICD-10-CM | POA: Diagnosis not present

## 2024-01-09 DIAGNOSIS — G8929 Other chronic pain: Secondary | ICD-10-CM | POA: Diagnosis not present

## 2024-01-09 DIAGNOSIS — M542 Cervicalgia: Secondary | ICD-10-CM | POA: Diagnosis not present

## 2024-01-09 DIAGNOSIS — M5416 Radiculopathy, lumbar region: Secondary | ICD-10-CM | POA: Diagnosis not present

## 2024-01-09 DIAGNOSIS — M5441 Lumbago with sciatica, right side: Secondary | ICD-10-CM | POA: Diagnosis not present

## 2024-01-10 ENCOUNTER — Other Ambulatory Visit: Payer: Self-pay | Admitting: Physical Medicine & Rehabilitation

## 2024-01-10 DIAGNOSIS — M5412 Radiculopathy, cervical region: Secondary | ICD-10-CM

## 2024-02-04 NOTE — Discharge Instructions (Signed)

## 2024-02-05 ENCOUNTER — Other Ambulatory Visit: Payer: Self-pay

## 2024-02-07 ENCOUNTER — Inpatient Hospital Stay
Admission: RE | Admit: 2024-02-07 | Discharge: 2024-02-07 | Disposition: A | Source: Ambulatory Visit | Attending: Physical Medicine & Rehabilitation | Admitting: Physical Medicine & Rehabilitation

## 2024-02-07 DIAGNOSIS — M5412 Radiculopathy, cervical region: Secondary | ICD-10-CM

## 2024-02-07 MED ORDER — TRIAMCINOLONE ACETONIDE 40 MG/ML IJ SUSP (RADIOLOGY)
60.0000 mg | Freq: Once | INTRAMUSCULAR | Status: AC
  Administered 2024-02-07: 60 mg via EPIDURAL

## 2024-02-07 MED ORDER — IOPAMIDOL (ISOVUE-300) INJECTION 61%
3.0000 mL | Freq: Once | INTRAVENOUS | Status: AC | PRN
  Administered 2024-02-07: 3 mL

## 2024-02-12 ENCOUNTER — Encounter
Admission: RE | Admit: 2024-02-12 | Discharge: 2024-02-12 | Disposition: A | Discharge status: Home / Self Care | Source: Ambulatory Visit

## 2024-02-12 ENCOUNTER — Other Ambulatory Visit: Payer: Self-pay

## 2024-02-12 VITALS — Ht 62.5 in | Wt 198.0 lb

## 2024-02-12 DIAGNOSIS — N926 Irregular menstruation, unspecified: Secondary | ICD-10-CM

## 2024-02-12 NOTE — Patient Instructions (Addendum)
 Your procedure is scheduled on: Tuesday 02/19/24 (arrive at 7:30 am by transport) To find out your arrival time, please call [phone] between 1PM - 3PM on: Monday 02/18/24 Report to the Registration Desk on the 1st floor of the [facility]. If your arrival time is 6:00 am, do not arrive before that time as the [facility] entrance doors do not open until 6:00 am.  REMEMBER: Instructions that are not followed completely may result in serious medical risk, up to and including death; or upon the discretion of your surgeon and anesthesiologist your surgery may need to be rescheduled.  Do not eat food after midnight the night before surgery.  No gum chewing or hard candies.  You may however, drink CLEAR liquids up to 2 hours before you are scheduled to arrive for your surgery. Do not drink anything within 2 hours of your scheduled arrival time.  Clear liquids include: - water  - apple juice without pulp - gatorade (not RED colors) - black coffee or tea (Do NOT add milk or creamers to the coffee or tea) Do NOT drink anything that is not on this list.  **Type 1 and Type 2 diabetics should only drink water.**  One week prior to surgery: Stop Anti-inflammatories (NSAIDS) such as Advil , Aleve , Ibuprofen , Motrin , Naproxen , Naprosyn  and Aspirin based products such as Excedrin, Goody's Powder, BC Powder.  You may however, continue to take Tylenol  if needed for pain up until the day of surgery.  Stop ANY OVER THE COUNTER supplements and vitamins for at least 7 days until after surgery.  Continue taking all of your other prescription medications up until the day of surgery.  ON THE DAY OF SURGERY ONLY TAKE THESE MEDICATIONS WITH SIPS OF WATER:  allopurinol (ZYLOPRIM) 100 MG tablet  estradiol  (ESTRACE ) 2 MG tablet  FLUoxetine HCl 60 MG TABS  gabapentin (NEURONTIN) 300 MG capsule  hydrOXYzine (VISTARIL) 25 MG capsule  medroxyPROGESTERone  (PROVERA ) 2.5 MG tablet  tiZANidine (ZANAFLEX) 4 MG  tablet   Use inhalers on the day of surgery and bring to the hospital.  No Alcohol for 24 hours before or after surgery.  No Smoking including e-cigarettes for 24 hours before surgery.  No chewable tobacco products for at least 6 hours before surgery.  No nicotine  patches on the day of surgery.  Do not use any recreational drugs for at least a week (preferably 2 weeks) before your surgery.  Please be advised that the combination of cocaine and anesthesia may have negative outcomes, up to and including death. If you test positive for cocaine, your surgery will be cancelled.  On the morning of surgery brush your teeth with toothpaste and water, you may rinse your mouth with mouthwash if you wish. Do not swallow any toothpaste or mouthwash.  Use CHG Soap or wipes as directed on instruction sheet. (You can pick this up at our office in the [facility], the building to the left of the [employer], [address] at [address]  Do not shave body hair from the neck down 48 hours before surgery.  Do not wear lotions, powders, or perfumes.   Wear comfortable clothing (specific to your surgery type) to the hospital.  Do not wear jewelry, make-up, hairpins, clips or nail polish.  For welded (permanent) jewelry: bracelets, anklets, waist bands, etc.  Please have this removed prior to surgery.  If it is not removed, there is a chance that hospital personnel will need to cut it  off on the day of surgery.  Contact lenses, hearing aids and dentures may not be worn into surgery.  Do not bring valuables to the hospital. [facility] is not responsible for any missing/lost belongings or valuables.   Notify your doctor if there is any change in your medical condition (cold, fever, infection).  After surgery, you can help prevent lung complications by doing breathing exercises.  Take deep breaths and cough every 1-2 hours. Your doctor may order a device called an Incentive  Spirometer to help you take deep breaths.  If you are being discharged the day of surgery, you will not be allowed to drive home. You will need a responsible individual to drive you home and stay with you for 24 hours after surgery.   If you are taking public transportation, you will need to have a responsible individual with you.  Please call the Pre-admissions Testing Dept. at [phone] if you have any questions about these instructions.  Surgery Visitation Policy:  Patients having surgery or a procedure may have two visitors.  Children under the age of 90 must have an adult with them who is not the patient.  Merch[employer], Retail to address health-related social needs:  [email or web address]                                                                                                             Preparing for Surgery with CHLORHEXIDINE GLUCONATE (CHG) Soap  Chlorhexidine Gluconate (CHG) Soap  o An antiseptic cleaner that kills germs and bonds with the skin to continue killing germs even after washing  o Used for showering the night before surgery and morning of surgery  Before surgery, you can play an important role by reducing the number of germs on your skin.  CHG (Chlorhexidine gluconate) soap is an antiseptic cleanser which kills germs and bonds with the skin to continue killing germs even after washing.  Please do not use if you have an allergy to CHG or antibacterial soaps. If your skin becomes reddened/irritated stop using the CHG.  1. Shower the NIGHT BEFORE SURGERY with CHG soap.  2. If you choose to wash your hair, wash your hair first as usual with your normal shampoo.  3. After shampooing, rinse your hair and body thoroughly to remove the shampoo.  4. Use CHG as you would any other liquid soap. You can apply CHG directly to the skin and wash gently with a clean washcloth.  5. Apply the CHG soap to your body only from the neck down. Do  not use on open wounds or open sores. Avoid contact with your eyes, ears, mouth, and genitals (private parts). Wash face and genitals (private parts) with your normal soap.  6. Wash thoroughly, paying special attention to the area where your surgery will be performed.  7. Thoroughly rinse your body with warm water.  8. Do not shower/wash with your normal soap after using and rinsing off the CHG soap.  9. Do not use lotions, oils, etc., after showering with CHG.  10. Pat yourself dry with a clean towel.  11. Wear clean pajamas to bed the night before surgery.  12. Place clean sheets on your bed the night of your shower and do not sleep with pets.  13. Do not apply any deodorants/lotions/powders.  14. Please wear clean clothes to the hospital.  15. Remember to brush your teeth with your regular toothpaste.

## 2024-02-18 MED ORDER — ORAL CARE MOUTH RINSE: 15.0000 mL | Freq: Once | OROMUCOSAL | Status: DC

## 2024-02-18 MED ORDER — LACTATED RINGERS IV SOLN: INTRAVENOUS | Status: DC

## 2024-02-18 MED ORDER — CHLORHEXIDINE GLUCONATE 0.12 % MT SOLN: 15.0000 mL | Freq: Once | OROMUCOSAL | Status: DC

## 2024-02-19 ENCOUNTER — Encounter: Admission: RE | Payer: Self-pay | Source: Home / Self Care

## 2024-02-19 ENCOUNTER — Ambulatory Visit: Admission: RE | Admit: 2024-02-19 | Source: Home / Self Care

## 2024-02-19 MED ORDER — LIDOCAINE-EPINEPHRINE 1 %-1:100000 IJ SOLN
INTRAMUSCULAR | Status: AC
  Filled 2024-02-19: qty 20

## 2024-02-19 MED ORDER — LIDOCAINE-EPINEPHRINE (PF) 1 %-1:200000 IJ SOLN
INTRAMUSCULAR | Status: AC
  Filled 2024-02-19: qty 30

## 2024-02-19 MED ORDER — CHLORHEXIDINE GLUCONATE 0.12 % MT SOLN
OROMUCOSAL | Status: AC
  Filled 2024-02-19: qty 15

## 2024-02-19 NOTE — Anesthesia Preprocedure Evaluation (Signed)
"                                    Anesthesia Evaluation  Patient identified by MRN, date of birth, ID band Patient awake    Reviewed: Allergy & Precautions, H&P , NPO status , Patient's Chart, lab work & pertinent test results, reviewed documented beta blocker date and time   Airway Mallampati: II  TM Distance: >3 FB Neck ROM: full    Dental  (+) Teeth Intact   Pulmonary neg pulmonary ROS, asthma , former smoker   Pulmonary exam normal        Cardiovascular Exercise Tolerance: Good negative cardio ROS Normal cardiovascular exam Rate:Normal     Neuro/Psych  PSYCHIATRIC DISORDERS Anxiety Depression    negative neurological ROS  negative psych ROS   GI/Hepatic negative GI ROS, Neg liver ROS,,,  Endo/Other  negative endocrine ROS    Renal/GU negative Renal ROS  negative genitourinary   Musculoskeletal   Abdominal   Peds  Hematology negative hematology ROS (+)   Anesthesia Other Findings   Reproductive/Obstetrics negative OB ROS                              Anesthesia Physical Anesthesia Plan  ASA: 3  Anesthesia Plan: General LMA   Post-op Pain Management:    Induction:   PONV Risk Score and Plan:   Airway Management Planned:   Additional Equipment:   Intra-op Plan:   Post-operative Plan:   Informed Consent: I have reviewed the patients History and Physical, chart, labs and discussed the procedure including the risks, benefits and alternatives for the proposed anesthesia with the patient or authorized representative who has indicated his/her understanding and acceptance.       Plan Discussed with: CRNA  Anesthesia Plan Comments:          Anesthesia Quick Evaluation  "

## 2024-02-20 ENCOUNTER — Other Ambulatory Visit: Payer: Self-pay

## 2024-03-04 ENCOUNTER — Ambulatory Visit: Admission: RE | Admit: 2024-03-04 | Source: Home / Self Care

## 2024-03-04 ENCOUNTER — Encounter: Admission: RE | Payer: Self-pay | Source: Home / Self Care
